# Patient Record
Sex: Female | Born: 1945
Health system: Southern US, Community
[De-identification: ages and names within clinical notes are randomized; demographics above are authoritative.]

## PROBLEM LIST (undated history)

## (undated) DIAGNOSIS — I10 Essential (primary) hypertension: Secondary | ICD-10-CM

## (undated) DIAGNOSIS — Z6834 Body mass index (BMI) 34.0-34.9, adult: Secondary | ICD-10-CM

## (undated) HISTORY — PX: ABDOMINAL HYSTERECTOMY: SHX81

## (undated) HISTORY — DX: Body mass index (bmi) 34.0-34.9, adult: Z68.34

## (undated) HISTORY — DX: Essential (primary) hypertension: I10

---

## 2006-04-06 ENCOUNTER — Emergency Department (HOSPITAL_COMMUNITY): Admission: EM | Admit: 2006-04-06 | Discharge: 2006-04-06 | Payer: Self-pay | Admitting: Emergency Medicine

## 2011-10-29 ENCOUNTER — Telehealth: Payer: Self-pay | Admitting: Family Medicine

## 2011-10-29 NOTE — Telephone Encounter (Addendum)
plz have her check her blood pressure at store for next 1-2 wks and call us with #s, may need sooner appt.

## 2011-10-29 NOTE — Telephone Encounter (Signed)
Patient called to schedule a new medicare patient appointment.  I let her know your first available is the end of September.  Patient said she's having blood pressure problems.  I asked her for her blood pressure readings,but she said she hasn't taken her blood pressure.  I scheduled her an appointment with you on 12/10/11.  Patient wanted me to leave you a message to let you know that she's having blood pressure problems.

## 2011-10-30 NOTE — Telephone Encounter (Signed)
Patient notified and will check numbers and call with an update.

## 2011-11-03 ENCOUNTER — Encounter: Payer: Self-pay | Admitting: Family Medicine

## 2011-11-03 ENCOUNTER — Ambulatory Visit (INDEPENDENT_AMBULATORY_CARE_PROVIDER_SITE_OTHER): Payer: Medicare HMO | Admitting: Family Medicine

## 2011-11-03 ENCOUNTER — Other Ambulatory Visit (HOSPITAL_COMMUNITY)
Admission: RE | Admit: 2011-11-03 | Discharge: 2011-11-03 | Disposition: A | Discharge status: Home / Self Care | Payer: Medicare HMO | Source: Ambulatory Visit | Attending: Family Medicine | Admitting: Family Medicine

## 2011-11-03 VITALS — BP 144/96 | HR 77 | Ht 67.0 in | Wt 215.0 lb

## 2011-11-03 DIAGNOSIS — E669 Obesity, unspecified: Secondary | ICD-10-CM | POA: Insufficient documentation

## 2011-11-03 DIAGNOSIS — Z01419 Encounter for gynecological examination (general) (routine) without abnormal findings: Secondary | ICD-10-CM

## 2011-11-03 DIAGNOSIS — I10 Essential (primary) hypertension: Secondary | ICD-10-CM | POA: Insufficient documentation

## 2011-11-03 DIAGNOSIS — Z124 Encounter for screening for malignant neoplasm of cervix: Secondary | ICD-10-CM | POA: Insufficient documentation

## 2011-11-03 MED ORDER — HYDROCHLOROTHIAZIDE 25 MG PO TABS: 25.0000 mg | ORAL_TABLET | Freq: Every day | ORAL | Status: DC

## 2011-11-03 NOTE — Progress Notes (Signed)
Subjective:     Kylie Diaz is a 66 y.o. female and is here for a comprehensive physical exam. The patient reports problems - Feeling very depressed. She graduated college magnetic mildly from [employer] in 2008 and has been unable to secure a job. 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 She's been out of her blood pressure medicines for several days. She has made an appointment with primary care physician the office of the [place] at [facility]. Her husband has a retired from the sheriff's office. He is recently been diagnosed with diabetes. He is unable to perform sexually secondary to this diagnosis and so she is no longer sexually active. The patient has undergone abdominal hysterectomy for fibroid uterus in the past and is without cervix. Her ovaries remain. She has not had any colonoscopy, or recent mammogram..  History   Social History  . Marital Status: Married    Spouse Name: N/A    Number of Children: N/A  . Years of Education: N/A   Occupational History  . Not on file.   Social History Main Topics  . Smoking status: Former Smoker    Types: Cigarettes    Quit date: 11/03/1978  . Smokeless tobacco: Not on file   Comment: 1980  . Alcohol Use: Yes     social  . Drug Use: No  . Sexually Active: Not Currently -- Female partner(s)    Birth Control/ Protection: Surgical     hysterectomy.   Other Topics Concern  . Not on file   Social History Narrative  . No narrative on file   Health Maintenance  Topic Date Due  . Tetanus/tdap  09/21/1965  . Mammogram  09/21/1996  . Colonoscopy  09/21/1996  . Zostavax  09/22/2006  . Pneumococcal Polysaccharide Vaccine Age 23 And Over  09/22/2011  . Influenza Vaccine  12/15/2011    The following portions of the patient's history were reviewed and updated as appropriate: allergies, current medications, past family history, past medical history, past social history, past surgical history and problem list.  Review of  Systems A comprehensive review of systems was negative.   Objective:    BP 144/96  Pulse 77  Ht 5\' 7"  (1.702 m)  Wt 215 lb (97.523 kg)  BMI 33.67 kg/m2 General appearance: alert, cooperative and appears stated age Head: Normocephalic, without obvious abnormality, atraumatic Neck: no adenopathy, supple, symmetrical, trachea midline and thyroid not enlarged, symmetric, no tenderness/mass/nodules Lungs: clear to auscultation bilaterally Breasts: normal appearance, no masses or tenderness Heart: regular rate and rhythm, S1, S2 normal, no murmur, click, rub or gallop Abdomen: soft, non-tender; bowel sounds normal; no masses,  no organomegaly Pelvic: adnexae not palpable, external genitalia normal, uterus surgically absent, vagina normal without discharge and Vaginal atrophy Extremities: extremities normal, atraumatic, no cyanosis or edema Pulses: 2+ and symmetric Skin: Skin color, texture, turgor normal. No rashes or lesions Lymph nodes: Cervical, supraclavicular, and axillary nodes normal. Neurologic: Grossly normal    Assessment:    Healthy female exam. Hypertension, and obesity.     Plan:     Pap smear today the patient last, lengthy discussion was had with the patient about need for further Pap smears that given the absence of the cervix was well at age of 63. The patient was very uncomfortable with this continuing this yearly. A mammogram scheduled for the patient. She was referred for her colonoscopy. Referral made to [facility] healthcare primary care maintenance. Refilled her HCTZ 25 mg by mouth  daily #30 given with 3 refills. See After Visit Summary for Counseling Recommendations

## 2011-11-03 NOTE — Patient Instructions (Signed)
Preventive Care for Adults, Female A healthy lifestyle and preventive care can promote health and wellness. Preventive health guidelines for women include the following key practices.  A routine yearly physical is a good way to check with your caregiver about your health and preventive screening. It is a chance to share any concerns and updates on your health, and to receive a thorough exam.   Visit your dentist for a routine exam and preventive care every 6 months. Brush your teeth twice a day and floss once a day. Good oral hygiene prevents tooth decay and gum disease.   The frequency of eye exams is based on your age, health, family medical history, use of contact lenses, and other factors. Follow your caregiver's recommendations for frequency of eye exams.   Eat a healthy diet. Foods like vegetables, fruits, whole grains, low-fat dairy products, and lean protein foods contain the nutrients you need without too many calories. Decrease your intake of foods high in solid fats, added sugars, and salt. Eat the right amount of calories for you.Get information about a proper diet from your caregiver, if necessary.   Regular physical exercise is one of the most important things you can do for your health. Most adults should get at least 150 minutes of moderate-intensity exercise (any activity that increases your heart rate and causes you to sweat) each week. In addition, most adults need muscle-strengthening exercises on 2 or more days a week.   Maintain a healthy weight. The body mass index (BMI) is a screening tool to identify possible weight problems. It provides an estimate of body fat based on height and weight. Your caregiver can help determine your BMI, and can help you achieve or maintain a healthy weight.For adults 20 years and older:   A BMI below 18.5 is considered underweight.   A BMI of 18.5 to 24.9 is normal.   A BMI of 25 to 29.9 is considered overweight.   A BMI of 30 and above is  considered obese.   Maintain normal blood lipids and cholesterol levels by exercising and minimizing your intake of saturated fat. Eat a balanced diet with plenty of fruit and vegetables. Blood tests for lipids and cholesterol should begin at age 20 and be repeated every 5 years. If your lipid or cholesterol levels are high, you are over 50, or you are at high risk for heart disease, you may need your cholesterol levels checked more frequently.Ongoing high lipid and cholesterol levels should be treated with medicines if diet and exercise are not effective.   If you smoke, find out from your caregiver how to quit. If you do not use tobacco, do not start.   If you are pregnant, do not drink alcohol. If you are breastfeeding, be very cautious about drinking alcohol. If you are not pregnant and choose to drink alcohol, do not exceed 1 drink per day. One drink is considered to be 12 ounces (355 mL) of beer, 5 ounces (148 mL) of wine, or 1.5 ounces (44 mL) of liquor.   Avoid use of street drugs. Do not share needles with anyone. Ask for help if you need support or instructions about stopping the use of drugs.   High blood pressure causes heart disease and increases the risk of stroke. Your blood pressure should be checked at least every 1 to 2 years. Ongoing high blood pressure should be treated with medicines if weight loss and exercise are not effective.   If you are 55 to 65   years old, ask your caregiver if you should take aspirin to prevent strokes.   Diabetes screening involves taking a blood sample to check your fasting blood sugar level. This should be done once every 3 years, after age 45, if you are within normal weight and without risk factors for diabetes. Testing should be considered at a younger age or be carried out more frequently if you are overweight and have at least 1 risk factor for diabetes.   Breast cancer screening is essential preventive care for women. You should practice "breast  self-awareness." This means understanding the normal appearance and feel of your breasts and may include breast self-examination. Any changes detected, no matter how small, should be reported to a caregiver. Women in their 20s and 30s should have a clinical breast exam (CBE) by a caregiver as part of a regular health exam every 1 to 3 years. After age 40, women should have a CBE every year. Starting at age 40, women should consider having a mammography (breast X-ray test) every year. Women who have a family history of breast cancer should talk to their caregiver about genetic screening. Women at a high risk of breast cancer should talk to their caregivers about having magnetic resonance imaging (MRI) and a mammography every year.   The Pap test is a screening test for cervical cancer. A Pap test can show cell changes on the cervix that might become cervical cancer if left untreated. A Pap test is a procedure in which cells are obtained and examined from the lower end of the uterus (cervix).   Women should have a Pap test starting at age 21.   Between ages 21 and 29, Pap tests should be repeated every 2 years.   Beginning at age 30, you should have a Pap test every 3 years as long as the past 3 Pap tests have been normal.   Some women have medical problems that increase the chance of getting cervical cancer. Talk to your caregiver about these problems. It is especially important to talk to your caregiver if a new problem develops soon after your last Pap test. In these cases, your caregiver may recommend more frequent screening and Pap tests.   The above recommendations are the same for women who have or have not gotten the vaccine for human papillomavirus (HPV).   If you had a hysterectomy for a problem that was not cancer or a condition that could lead to cancer, then you no longer need Pap tests. Even if you no longer need a Pap test, a regular exam is a good idea to make sure no other problems are  starting.   If you are between ages 65 and 70, and you have had normal Pap tests going back 10 years, you no longer need Pap tests. Even if you no longer need a Pap test, a regular exam is a good idea to make sure no other problems are starting.   If you have had past treatment for cervical cancer or a condition that could lead to cancer, you need Pap tests and screening for cancer for at least 20 years after your treatment.   If Pap tests have been discontinued, risk factors (such as a new sexual partner) need to be reassessed to determine if screening should be resumed.   The HPV test is an additional test that may be used for cervical cancer screening. The HPV test looks for the virus that can cause the cell changes on the cervix.   The cells collected during the Pap test can be tested for HPV. The HPV test could be used to screen women aged 30 years and older, and should be used in women of any age who have unclear Pap test results. After the age of 30, women should have HPV testing at the same frequency as a Pap test.   Colorectal cancer can be detected and often prevented. Most routine colorectal cancer screening begins at the age of 50 and continues through age 75. However, your caregiver may recommend screening at an earlier age if you have risk factors for colon cancer. On a yearly basis, your caregiver may provide home test kits to check for hidden blood in the stool. Use of a small camera at the end of a tube, to directly examine the colon (sigmoidoscopy or colonoscopy), can detect the earliest forms of colorectal cancer. Talk to your caregiver about this at age 50, when routine screening begins. Direct examination of the colon should be repeated every 5 to 10 years through age 75, unless early forms of pre-cancerous polyps or small growths are found.   Hepatitis C blood testing is recommended for all people born from 1945 through 1965 and any individual with known risks for hepatitis C.    Practice safe sex. Use condoms and avoid high-risk sexual practices to reduce the spread of sexually transmitted infections (STIs). STIs include gonorrhea, chlamydia, syphilis, trichomonas, herpes, HPV, and human immunodeficiency virus (HIV). Herpes, HIV, and HPV are viral illnesses that have no cure. They can result in disability, cancer, and death. Sexually active women aged 25 and younger should be checked for chlamydia. Older women with new or multiple partners should also be tested for chlamydia. Testing for other STIs is recommended if you are sexually active and at increased risk.   Osteoporosis is a disease in which the bones lose minerals and strength with aging. This can result in serious bone fractures. The risk of osteoporosis can be identified using a bone density scan. Women ages 65 and over and women at risk for fractures or osteoporosis should discuss screening with their caregivers. Ask your caregiver whether you should take a calcium supplement or vitamin D to reduce the rate of osteoporosis.   Menopause can be associated with physical symptoms and risks. Hormone replacement therapy is available to decrease symptoms and risks. You should talk to your caregiver about whether hormone replacement therapy is right for you.   Use sunscreen with sun protection factor (SPF) of 30 or more. Apply sunscreen liberally and repeatedly throughout the day. You should seek shade when your shadow is shorter than you. Protect yourself by wearing long sleeves, pants, a wide-brimmed hat, and sunglasses year round, whenever you are outdoors.   Once a month, do a whole body skin exam, using a mirror to look at the skin on your back. Notify your caregiver of new moles, moles that have irregular borders, moles that are larger than a pencil eraser, or moles that have changed in shape or color.   Stay current with required immunizations.   Influenza. You need a dose every fall (or winter). The composition of  the flu vaccine changes each year, so being vaccinated once is not enough.   Pneumococcal polysaccharide. You need 1 to 2 doses if you smoke cigarettes or if you have certain chronic medical conditions. You need 1 dose at age 65 (or older) if you have never been vaccinated.   Tetanus, diphtheria, pertussis (Tdap, Td). Get 1 dose of   Tdap vaccine if you are younger than age 65, are over 65 and have contact with an infant, are a healthcare worker, are pregnant, or simply want to be protected from whooping cough. After that, you need a Td booster dose every 10 years. Consult your caregiver if you have not had at least 3 tetanus and diphtheria-containing shots sometime in your life or have a deep or dirty wound.   HPV. You need this vaccine if you are a woman age 26 or younger. The vaccine is given in 3 doses over 6 months.   Measles, mumps, rubella (MMR). You need at least 1 dose of MMR if you were born in 1957 or later. You may also need a second dose.   Meningococcal. If you are age 19 to 21 and a first-year college student living in a residence hall, or have one of several medical conditions, you need to get vaccinated against meningococcal disease. You may also need additional booster doses.   Zoster (shingles). If you are age 60 or older, you should get this vaccine.   Varicella (chickenpox). If you have never had chickenpox or you were vaccinated but received only 1 dose, talk to your caregiver to find out if you need this vaccine.   Hepatitis A. You need this vaccine if you have a specific risk factor for hepatitis A virus infection or you simply wish to be protected from this disease. The vaccine is usually given as 2 doses, 6 to 18 months apart.   Hepatitis B. You need this vaccine if you have a specific risk factor for hepatitis B virus infection or you simply wish to be protected from this disease. The vaccine is given in 3 doses, usually over 6 months.  Preventive Services /  Frequency Ages 19 to 39  Blood pressure check.** / Every 1 to 2 years.   Lipid and cholesterol check.** / Every 5 years beginning at age 20.   Clinical breast exam.** / Every 3 years for women in their 20s and 30s.   Pap test.** / Every 2 years from ages 21 through 29. Every 3 years starting at age 30 through age 65 or 70 with a history of 3 consecutive normal Pap tests.   HPV screening.** / Every 3 years from ages 30 through ages 65 to 70 with a history of 3 consecutive normal Pap tests.   Hepatitis C blood test.** / For any individual with known risks for hepatitis C.   Skin self-exam. / Monthly.   Influenza immunization.** / Every year.   Pneumococcal polysaccharide immunization.** / 1 to 2 doses if you smoke cigarettes or if you have certain chronic medical conditions.   Tetanus, diphtheria, pertussis (Tdap, Td) immunization. / A one-time dose of Tdap vaccine. After that, you need a Td booster dose every 10 years.   HPV immunization. / 3 doses over 6 months, if you are 26 and younger.   Measles, mumps, rubella (MMR) immunization. / You need at least 1 dose of MMR if you were born in 1957 or later. You may also need a second dose.   Meningococcal immunization. / 1 dose if you are age 19 to 21 and a first-year college student living in a residence hall, or have one of several medical conditions, you need to get vaccinated against meningococcal disease. You may also need additional booster doses.   Varicella immunization.** / Consult your caregiver.   Hepatitis A immunization.** / Consult your caregiver. 2 doses, 6 to 18 months   apart.   Hepatitis B immunization.** / Consult your caregiver. 3 doses usually over 6 months.  Ages 40 to 64  Blood pressure check.** / Every 1 to 2 years.   Lipid and cholesterol check.** / Every 5 years beginning at age 20.   Clinical breast exam.** / Every year after age 40.   Mammogram.** / Every year beginning at age 40 and continuing for as  long as you are in good health. Consult with your caregiver.   Pap test.** / Every 3 years starting at age 30 through age 65 or 70 with a history of 3 consecutive normal Pap tests.   HPV screening.** / Every 3 years from ages 30 through ages 65 to 70 with a history of 3 consecutive normal Pap tests.   Fecal occult blood test (FOBT) of stool. / Every year beginning at age 50 and continuing until age 75. You may not need to do this test if you get a colonoscopy every 10 years.   Flexible sigmoidoscopy or colonoscopy.** / Every 5 years for a flexible sigmoidoscopy or every 10 years for a colonoscopy beginning at age 50 and continuing until age 75.   Hepatitis C blood test.** / For all people born from 1945 through 1965 and any individual with known risks for hepatitis C.   Skin self-exam. / Monthly.   Influenza immunization.** / Every year.   Pneumococcal polysaccharide immunization.** / 1 to 2 doses if you smoke cigarettes or if you have certain chronic medical conditions.   Tetanus, diphtheria, pertussis (Tdap, Td) immunization.** / A one-time dose of Tdap vaccine. After that, you need a Td booster dose every 10 years.   Measles, mumps, rubella (MMR) immunization. / You need at least 1 dose of MMR if you were born in 1957 or later. You may also need a second dose.   Varicella immunization.** / Consult your caregiver.   Meningococcal immunization.** / Consult your caregiver.   Hepatitis A immunization.** / Consult your caregiver. 2 doses, 6 to 18 months apart.   Hepatitis B immunization.** / Consult your caregiver. 3 doses, usually over 6 months.  Ages 65 and over  Blood pressure check.** / Every 1 to 2 years.   Lipid and cholesterol check.** / Every 5 years beginning at age 20.   Clinical breast exam.** / Every year after age 40.   Mammogram.** / Every year beginning at age 40 and continuing for as long as you are in good health. Consult with your caregiver.   Pap test.** /  Every 3 years starting at age 30 through age 65 or 70 with a 3 consecutive normal Pap tests. Testing can be stopped between 65 and 70 with 3 consecutive normal Pap tests and no abnormal Pap or HPV tests in the past 10 years.   HPV screening.** / Every 3 years from ages 30 through ages 65 or 70 with a history of 3 consecutive normal Pap tests. Testing can be stopped between 65 and 70 with 3 consecutive normal Pap tests and no abnormal Pap or HPV tests in the past 10 years.   Fecal occult blood test (FOBT) of stool. / Every year beginning at age 50 and continuing until age 75. You may not need to do this test if you get a colonoscopy every 10 years.   Flexible sigmoidoscopy or colonoscopy.** / Every 5 years for a flexible sigmoidoscopy or every 10 years for a colonoscopy beginning at age 50 and continuing until age 75.   Hepatitis   C blood test.** / For all people born from 1945 through 1965 and any individual with known risks for hepatitis C.   Osteoporosis screening.** / A one-time screening for women ages 65 and over and women at risk for fractures or osteoporosis.   Skin self-exam. / Monthly.   Influenza immunization.** / Every year.   Pneumococcal polysaccharide immunization.** / 1 dose at age 65 (or older) if you have never been vaccinated.   Tetanus, diphtheria, pertussis (Tdap, Td) immunization. / A one-time dose of Tdap vaccine if you are over 65 and have contact with an infant, are a healthcare worker, or simply want to be protected from whooping cough. After that, you need a Td booster dose every 10 years.   Varicella immunization.** / Consult your caregiver.   Meningococcal immunization.** / Consult your caregiver.   Hepatitis A immunization.** / Consult your caregiver. 2 doses, 6 to 18 months apart.   Hepatitis B immunization.** / Check with your caregiver. 3 doses, usually over 6 months.  ** Family history and personal history of risk and conditions may change your caregiver's  recommendations. Document Released: 04/28/2001 Document Revised: 02/19/2011 Document Reviewed: 07/28/2010 [employer] Patient Information 2012 [employer]. 

## 2011-11-18 ENCOUNTER — Encounter: Payer: Self-pay | Admitting: Family Medicine

## 2011-12-10 ENCOUNTER — Ambulatory Visit: Payer: Self-pay | Admitting: Family Medicine

## 2011-12-11 ENCOUNTER — Ambulatory Visit: Payer: Medicare HMO | Admitting: Family Medicine

## 2012-01-08 ENCOUNTER — Ambulatory Visit: Payer: Medicare HMO | Admitting: Family Medicine

## 2012-02-10 ENCOUNTER — Ambulatory Visit: Payer: Self-pay | Admitting: Internal Medicine

## 2012-03-22 ENCOUNTER — Encounter: Payer: Self-pay | Admitting: Internal Medicine

## 2012-03-22 ENCOUNTER — Ambulatory Visit (INDEPENDENT_AMBULATORY_CARE_PROVIDER_SITE_OTHER): Payer: Medicare HMO | Admitting: Internal Medicine

## 2012-03-22 ENCOUNTER — Other Ambulatory Visit: Payer: Self-pay | Admitting: Family Medicine

## 2012-03-22 ENCOUNTER — Ambulatory Visit (INDEPENDENT_AMBULATORY_CARE_PROVIDER_SITE_OTHER)
Admission: RE | Admit: 2012-03-22 | Discharge: 2012-03-22 | Disposition: A | Discharge status: Home / Self Care | Payer: Medicare HMO | Source: Ambulatory Visit | Attending: Internal Medicine | Admitting: Internal Medicine

## 2012-03-22 VITALS — BP 124/82 | HR 81 | Temp 98.6°F | Ht 67.75 in | Wt 219.5 lb

## 2012-03-22 DIAGNOSIS — E785 Hyperlipidemia, unspecified: Secondary | ICD-10-CM

## 2012-03-22 DIAGNOSIS — I10 Essential (primary) hypertension: Secondary | ICD-10-CM

## 2012-03-22 DIAGNOSIS — D649 Anemia, unspecified: Secondary | ICD-10-CM

## 2012-03-22 DIAGNOSIS — J40 Bronchitis, not specified as acute or chronic: Secondary | ICD-10-CM

## 2012-03-22 DIAGNOSIS — F4321 Adjustment disorder with depressed mood: Secondary | ICD-10-CM

## 2012-03-22 LAB — LIPID PANEL
Cholesterol: 155 mg/dL (ref 0–200)
LDL Cholesterol: 91 mg/dL (ref 0–99)
Total CHOL/HDL Ratio: 3
Triglycerides: 67 mg/dL (ref 0.0–149.0)
VLDL: 13.4 mg/dL (ref 0.0–40.0)

## 2012-03-22 LAB — CBC WITH DIFFERENTIAL/PLATELET
Basophils Absolute: 0 10*3/uL (ref 0.0–0.1)
Eosinophils Absolute: 0.1 10*3/uL (ref 0.0–0.7)
Eosinophils Relative: 1.1 % (ref 0.0–5.0)
Hemoglobin: 11.2 g/dL — ABNORMAL LOW (ref 12.0–15.0)
Lymphs Abs: 1.8 10*3/uL (ref 0.7–4.0)
MCHC: 32 g/dL (ref 30.0–36.0)
MCV: 86.8 fl (ref 78.0–100.0)
Neutro Abs: 4.6 10*3/uL (ref 1.4–7.7)
Neutrophils Relative %: 65.4 % (ref 43.0–77.0)
Platelets: 455 10*3/uL — ABNORMAL HIGH (ref 150.0–400.0)
RBC: 4.03 Mil/uL (ref 3.87–5.11)
RDW: 13.8 % (ref 11.5–14.6)

## 2012-03-22 LAB — COMPREHENSIVE METABOLIC PANEL
AST: 16 U/L (ref 0–37)
BUN: 7 mg/dL (ref 6–23)
Chloride: 106 mEq/L (ref 96–112)
Sodium: 140 mEq/L (ref 135–145)
Total Protein: 7.5 g/dL (ref 6.0–8.3)

## 2012-03-22 LAB — MICROALBUMIN / CREATININE URINE RATIO: Creatinine,U: 177.9 mg/dL

## 2012-03-22 MED ORDER — LEVOFLOXACIN 500 MG PO TABS: 500.0000 mg | ORAL_TABLET | Freq: Every day | ORAL | Status: DC

## 2012-03-22 MED ORDER — HYDROCHLOROTHIAZIDE 25 MG PO TABS: 25.0000 mg | ORAL_TABLET | Freq: Every day | ORAL | Status: DC

## 2012-03-22 NOTE — Assessment & Plan Note (Signed)
Patient having some difficulty adjusting to current job situation. Offered support today. Gave her information for local counselor to work with.

## 2012-03-22 NOTE — Assessment & Plan Note (Signed)
Blood pressure well-controlled on current medications. Will check renal function with labs today. Continue hydrochlorothiazide. Followup in one month for physical exam.

## 2012-03-22 NOTE — Assessment & Plan Note (Signed)
Symptoms and exam are most consistent with bronchitis. Exam was remarkable for some focal findings of rhonchi and poor air transfer in the left upper lobe however chest x-ray was normal. Will treat with Levaquin. Patient will call her symptoms of cough and shortness of breath are not improving over the next 24-48 hours. Followup in 2 weeks.

## 2012-03-22 NOTE — Progress Notes (Signed)
Subjective:    Patient ID: Kylie Diaz, female    DOB: February 28, 1946, 67 y.o.   MRN: [id number]  HPI 67 year old female with history of hypertension presents to establish care. She reports it has been a difficult time for her. She recently completed a bachelors degree in Investment banker, corporate. However, she has had difficulty finding a job. She is currently working as a Lawyer. She is having difficulty paying her student months. She describes frustration with this and some depressed mood. She is interested in seeing a counselor to help work through symptoms. Next  In regards to hypertension, she reports compliance with her medication. She denies any headache, palpitations, chest pain.  She is also concerned today about 3-week history of cough and occasional wheezing. Symptoms first began around Christmas. They started with subjective fever, chills, general malaise, sore throat, nasal congestion. She then developed cough productive of purulent sputum. She has been taking guaifenesin with no improvement in her symptoms. She has had some mild shortness of breath and occasional wheezing. She denies any recurrent fever or chest pain.  Outpatient Encounter Prescriptions as of 03/22/2012  Medication Sig Dispense Refill  . guaifenesin (MUCUS RELIEF) 400 MG TABS Take 400 mg by mouth every 4 (four) hours.      . [DISCONTINUED] hydrochlorothiazide (HYDRODIURIL) 25 MG tablet Take 1 tablet (25 mg total) by mouth daily.  30 tablet  3  . levofloxacin (LEVAQUIN) 500 MG tablet Take 1 tablet (500 mg total) by mouth daily.  7 tablet  0   BP 124/82  Pulse 81  Temp 98.6 F (37 C) (Oral)  Ht 5' 7.75" (1.721 m)  Wt 219 lb 8 oz (99.565 kg)  BMI 33.62 kg/m2  SpO2 95%  Review of Systems  Constitutional: Positive for fatigue. Negative for fever, chills, appetite change and unexpected weight change.  HENT: Negative for ear pain, congestion, sore throat, trouble swallowing, neck pain, voice change and sinus  pressure.   Eyes: Negative for visual disturbance.  Respiratory: Positive for cough and shortness of breath. Negative for wheezing and stridor.   Cardiovascular: Negative for chest pain, palpitations and leg swelling.  Gastrointestinal: Negative for nausea, vomiting, abdominal pain, diarrhea, constipation, blood in stool, abdominal distention and anal bleeding.  Genitourinary: Negative for dysuria and flank pain.  Musculoskeletal: Negative for myalgias, arthralgias and gait problem.  Skin: Negative for color change and rash.  Neurological: Negative for dizziness and headaches.  Hematological: Negative for adenopathy. Does not bruise/bleed easily.  Psychiatric/Behavioral: Positive for dysphoric mood. Negative for suicidal ideas and sleep disturbance. The patient is not nervous/anxious.        Objective:   Physical Exam  Constitutional: She is oriented to person, place, and time. She appears well-developed and well-nourished. No distress.  HENT:  Head: Normocephalic and atraumatic.  Right Ear: External ear normal.  Left Ear: External ear normal.  Nose: Nose normal.  Mouth/Throat: Oropharynx is clear and moist. No oropharyngeal exudate.  Eyes: Conjunctivae normal are normal. Pupils are equal, round, and reactive to light. Right eye exhibits no discharge. Left eye exhibits no discharge. No scleral icterus.  Neck: Normal range of motion. Neck supple. No tracheal deviation present. No thyromegaly present.  Cardiovascular: Normal rate, regular rhythm, normal heart sounds and intact distal pulses.  Exam reveals no gallop and no friction rub.   No murmur heard. Pulmonary/Chest: Effort normal. No accessory muscle usage. Not tachypneic. No respiratory distress. She has decreased breath sounds in the left upper field. She has no wheezes. She  has rhonchi in the left upper field. She has no rales. She exhibits no tenderness.  Musculoskeletal: Normal range of motion. She exhibits no edema and no  tenderness.  Lymphadenopathy:    She has no cervical adenopathy.  Neurological: She is alert and oriented to person, place, and time. No cranial nerve deficit. She exhibits normal muscle tone. Coordination normal.  Skin: Skin is warm and dry. No rash noted. She is not diaphoretic. No erythema. No pallor.  Psychiatric: She has a normal mood and affect. Her behavior is normal. Judgment and thought content normal.          Assessment & Plan:

## 2012-03-22 NOTE — Telephone Encounter (Signed)
Refill on her HCTZ 25 mg.

## 2012-03-23 ENCOUNTER — Telehealth: Payer: Self-pay | Admitting: *Deleted

## 2012-03-23 ENCOUNTER — Ambulatory Visit: Payer: Medicare HMO

## 2012-03-23 DIAGNOSIS — D649 Anemia, unspecified: Secondary | ICD-10-CM

## 2012-03-23 NOTE — Telephone Encounter (Signed)
Will route to new PCP 

## 2012-03-23 NOTE — Telephone Encounter (Signed)
This pt is coming in tomorrow(03-24-2012) for labs, is it just for the IFOB? Thank you

## 2012-03-24 ENCOUNTER — Other Ambulatory Visit: Payer: Medicare HMO

## 2012-03-24 NOTE — Telephone Encounter (Signed)
Yes, as I think the ferritin was already added on.

## 2012-03-29 NOTE — Progress Notes (Signed)
Call patient home number. Spoke with patient spouse Verdon Cummins. Per spouse will have patient call office back regarding her additional mammo that needed to be done.

## 2012-03-31 ENCOUNTER — Other Ambulatory Visit: Payer: Medicare HMO

## 2012-03-31 ENCOUNTER — Other Ambulatory Visit: Payer: Self-pay | Admitting: Internal Medicine

## 2012-03-31 DIAGNOSIS — D649 Anemia, unspecified: Secondary | ICD-10-CM

## 2012-04-01 ENCOUNTER — Other Ambulatory Visit (INDEPENDENT_AMBULATORY_CARE_PROVIDER_SITE_OTHER): Payer: Medicare HMO

## 2012-04-01 ENCOUNTER — Other Ambulatory Visit: Payer: Self-pay | Admitting: Internal Medicine

## 2012-04-01 DIAGNOSIS — D649 Anemia, unspecified: Secondary | ICD-10-CM

## 2012-04-04 ENCOUNTER — Encounter: Payer: Self-pay | Admitting: Internal Medicine

## 2012-04-04 ENCOUNTER — Ambulatory Visit (INDEPENDENT_AMBULATORY_CARE_PROVIDER_SITE_OTHER): Payer: Medicare HMO | Admitting: Internal Medicine

## 2012-04-04 ENCOUNTER — Ambulatory Visit: Payer: Medicare HMO | Admitting: Internal Medicine

## 2012-04-04 VITALS — BP 126/78 | HR 96 | Temp 98.1°F | Ht 67.25 in | Wt 216.5 lb

## 2012-04-04 DIAGNOSIS — D649 Anemia, unspecified: Secondary | ICD-10-CM | POA: Insufficient documentation

## 2012-04-04 DIAGNOSIS — F4321 Adjustment disorder with depressed mood: Secondary | ICD-10-CM

## 2012-04-04 LAB — CBC WITH DIFFERENTIAL/PLATELET
Lymphs Abs: 1.7 10*3/uL (ref 0.7–4.0)
MCHC: 32.6 g/dL (ref 30.0–36.0)
Platelets: 368 10*3/uL (ref 150.0–400.0)
RDW: 14.3 % (ref 11.5–14.6)

## 2012-04-04 LAB — IBC PANEL: Saturation Ratios: 24.1 % (ref 20.0–50.0)

## 2012-04-04 MED ORDER — FLUOXETINE HCL 20 MG PO TABS: 20.0000 mg | ORAL_TABLET | Freq: Every day | ORAL | Status: DC

## 2012-04-04 NOTE — Progress Notes (Signed)
Subjective:    Patient ID: Kylie Diaz, female    DOB: [birth year], 67 y.o.   MRN: [id number]  HPI 67 year old female with history of hypertension presents for followup. At her last visit, she reported feeling increased symptoms of depressed mood and frustration given the fact that she recently completed her bachelors degree in [employer] but has been unable to find a job. She reports worsening frustration and depressed mood. She is tearful today. She has started working as a Lawyer. She reports frustration the fact that she has now completed her degree but cannot afford to pay back her student months. She notes every day symptoms of depression. She has never taken medication to help with depression. She sometimes has difficulty sleeping and in her interactions with others particularly her husband.  At her last visit, she was also noted to be anemic. She reports she has been told she is anemic in the past. She denies any vaginal bleeding. She denies any blood in her stool. She is up-to-date on colonoscopy. She denies fatigue.  Outpatient Encounter Prescriptions as of 04/04/2012  Medication Sig Dispense Refill  . Calcium Carbonate-Vitamin D (CALCIUM-VITAMIN D) 500-200 MG-UNIT per tablet Take 2 tablets by mouth 3 (three) times daily with meals.      . hydrochlorothiazide (HYDRODIURIL) 25 MG tablet Take 1 tablet (25 mg total) by mouth daily.  30 tablet  6  . FLUoxetine (PROZAC) 20 MG tablet Take 1 tablet (20 mg total) by mouth daily.  30 tablet  3   BP 126/78  Pulse 96  Temp 98.1 F (36.7 C) (Oral)  Ht 5' 7.25" (1.708 m)  Wt 216 lb 8 oz (98.204 kg)  BMI 33.66 kg/m2  SpO2 98%  Review of Systems  Constitutional: Negative for fever, chills, appetite change, fatigue and unexpected weight change.  HENT: Negative for ear pain, congestion, sore throat, trouble swallowing, neck pain, voice change and sinus pressure.   Eyes: Negative for visual disturbance.  Respiratory: Negative  for cough, shortness of breath, wheezing and stridor.   Cardiovascular: Negative for chest pain, palpitations and leg swelling.  Gastrointestinal: Negative for nausea, vomiting, abdominal pain, diarrhea, constipation, blood in stool, abdominal distention and anal bleeding.  Genitourinary: Negative for dysuria and flank pain.  Musculoskeletal: Negative for myalgias, arthralgias and gait problem.  Skin: Negative for color change and rash.  Neurological: Negative for dizziness and headaches.  Hematological: Negative for adenopathy. Does not bruise/bleed easily.  Psychiatric/Behavioral: Positive for sleep disturbance and dysphoric mood. Negative for suicidal ideas and decreased concentration. The patient is not nervous/anxious.        Objective:   Physical Exam  Constitutional: She is oriented to person, place, and time. She appears well-developed and well-nourished. No distress.  HENT:  Head: Normocephalic and atraumatic.  Right Ear: External ear normal.  Left Ear: External ear normal.  Nose: Nose normal.  Mouth/Throat: Oropharynx is clear and moist. No oropharyngeal exudate.  Eyes: Conjunctivae normal are normal. Pupils are equal, round, and reactive to light. Right eye exhibits no discharge. Left eye exhibits no discharge. No scleral icterus.  Neck: Normal range of motion. Neck supple. No tracheal deviation present. No thyromegaly present.  Cardiovascular: Normal rate, regular rhythm, normal heart sounds and intact distal pulses.  Exam reveals no gallop and no friction rub.   No murmur heard. Pulmonary/Chest: Effort normal and breath sounds normal. No respiratory distress. She has no wheezes. She has no rales. She exhibits no tenderness.  Musculoskeletal: Normal range of motion. She  exhibits no edema and no tenderness.  Lymphadenopathy:    She has no cervical adenopathy.  Neurological: She is alert and oriented to person, place, and time. No cranial nerve deficit. She exhibits normal  muscle tone. Coordination normal.  Skin: Skin is warm and dry. No rash noted. She is not diaphoretic. No erythema. No pallor.  Psychiatric: Her speech is normal and behavior is normal. Judgment and thought content normal. Cognition and memory are normal. She exhibits a depressed mood.          Assessment & Plan:

## 2012-04-04 NOTE — ED Provider Notes (Signed)
Order(s) created erroneously. Erroneous order ID: [id number] Order moved by: PETERS, MARSHA B Order move date/time: 04/04/2012  4:30 PM Source Patient:    [id number] Source Contact: 04/01/2012 Destination Patient:   [id number] Destination Contact: 12/26/2010

## 2012-04-04 NOTE — Assessment & Plan Note (Signed)
Noted to have anemia on previous labs with Hgb 11.2. Ferritin was elevated. Stool hemocult neg. Will recheck CBC with ferritin and iron panel today.

## 2012-04-04 NOTE — Assessment & Plan Note (Signed)
Greater than face to face discussion with pt today discussing etiology of depressive symptoms and plan for care. Will set up for counseling with career counselor/psychologist. Will start Fluoxetine 20mg  daily. Pt will RTC in 1 month and prn.

## 2012-04-11 ENCOUNTER — Ambulatory Visit: Payer: Medicare HMO

## 2012-04-12 ENCOUNTER — Ambulatory Visit: Payer: Medicare HMO

## 2012-04-30 ENCOUNTER — Other Ambulatory Visit: Payer: Self-pay

## 2012-05-20 ENCOUNTER — Ambulatory Visit: Payer: Medicare HMO | Admitting: Internal Medicine

## 2012-08-22 ENCOUNTER — Ambulatory Visit: Payer: Medicare HMO | Admitting: Internal Medicine

## 2012-09-07 ENCOUNTER — Telehealth: Payer: Self-pay | Admitting: Internal Medicine

## 2012-09-07 NOTE — Telephone Encounter (Signed)
hydrochlorothiazide (HYDRODIURIL) 25 MG tablet °# 90 °

## 2012-09-08 MED ORDER — HYDROCHLOROTHIAZIDE 25 MG PO TABS: 25.0000 mg | ORAL_TABLET | Freq: Every day | ORAL | Status: DC

## 2012-09-08 NOTE — Telephone Encounter (Signed)
DONE

## 2012-10-19 ENCOUNTER — Other Ambulatory Visit: Payer: Self-pay

## 2012-11-20 ENCOUNTER — Other Ambulatory Visit: Payer: Self-pay | Admitting: Internal Medicine

## 2012-11-21 NOTE — Telephone Encounter (Signed)
Eprescribed.

## 2012-12-01 ENCOUNTER — Encounter: Payer: Medicare HMO | Admitting: Internal Medicine

## 2013-01-19 ENCOUNTER — Other Ambulatory Visit: Payer: Self-pay

## 2013-04-11 ENCOUNTER — Encounter: Payer: Self-pay | Admitting: Internal Medicine

## 2013-04-11 ENCOUNTER — Ambulatory Visit (INDEPENDENT_AMBULATORY_CARE_PROVIDER_SITE_OTHER): Payer: Medicare HMO | Admitting: Internal Medicine

## 2013-04-11 VITALS — BP 130/96 | HR 119 | Temp 97.7°F | Ht 67.25 in | Wt 213.0 lb

## 2013-04-11 DIAGNOSIS — L918 Other hypertrophic disorders of the skin: Secondary | ICD-10-CM

## 2013-04-11 DIAGNOSIS — L909 Atrophic disorder of skin, unspecified: Secondary | ICD-10-CM

## 2013-04-11 DIAGNOSIS — I10 Essential (primary) hypertension: Secondary | ICD-10-CM

## 2013-04-11 DIAGNOSIS — D229 Melanocytic nevi, unspecified: Secondary | ICD-10-CM

## 2013-04-11 DIAGNOSIS — L919 Hypertrophic disorder of the skin, unspecified: Secondary | ICD-10-CM

## 2013-04-11 DIAGNOSIS — Z6835 Body mass index (BMI) 35.0-35.9, adult: Secondary | ICD-10-CM | POA: Insufficient documentation

## 2013-04-11 DIAGNOSIS — D239 Other benign neoplasm of skin, unspecified: Secondary | ICD-10-CM

## 2013-04-11 DIAGNOSIS — Z0000 Encounter for general adult medical examination without abnormal findings: Secondary | ICD-10-CM

## 2013-04-11 DIAGNOSIS — Z1239 Encounter for other screening for malignant neoplasm of breast: Secondary | ICD-10-CM

## 2013-04-11 DIAGNOSIS — Z1211 Encounter for screening for malignant neoplasm of colon: Secondary | ICD-10-CM

## 2013-04-11 DIAGNOSIS — Z6834 Body mass index (BMI) 34.0-34.9, adult: Secondary | ICD-10-CM

## 2013-04-11 DIAGNOSIS — E669 Obesity, unspecified: Secondary | ICD-10-CM

## 2013-04-11 HISTORY — DX: Body mass index (BMI) 34.0-34.9, adult: Z68.34

## 2013-04-11 LAB — COMPREHENSIVE METABOLIC PANEL
ALK PHOS: 62 U/L (ref 39–117)
ALT: 10 U/L (ref 0–35)
AST: 11 U/L (ref 0–37)
Albumin: 4 g/dL (ref 3.5–5.2)
BILIRUBIN TOTAL: 0.5 mg/dL (ref 0.3–1.2)
BUN: 11 mg/dL (ref 6–23)
CO2: 27 mEq/L (ref 19–32)
Calcium: 9.6 mg/dL (ref 8.4–10.5)
Chloride: 108 mEq/L (ref 96–112)
Creatinine, Ser: 0.8 mg/dL (ref 0.4–1.2)
GFR: 89.55 mL/min (ref >60.00)
GLUCOSE: 97 mg/dL (ref 70–99)
Potassium: 4.1 mEq/L (ref 3.5–5.1)
SODIUM: 140 meq/L (ref 135–145)
TOTAL PROTEIN: 7.3 g/dL (ref 6.0–8.3)

## 2013-04-11 LAB — CBC WITH DIFFERENTIAL/PLATELET
BASOS ABS: 0 10*3/uL (ref 0.0–0.1)
Basophils Relative: 0.5 % (ref 0.0–3.0)
EOS ABS: 0.1 10*3/uL (ref 0.0–0.7)
Eosinophils Relative: 0.8 % (ref 0.0–5.0)
HCT: 35.7 % — ABNORMAL LOW (ref 36.0–46.0)
HEMOGLOBIN: 11.5 g/dL — AB (ref 12.0–15.0)
Lymphocytes Relative: 19.8 % (ref 12.0–46.0)
Lymphs Abs: 1.8 10*3/uL (ref 0.7–4.0)
MCHC: 32.2 g/dL (ref 30.0–36.0)
MCV: 87.3 fl (ref 78.0–100.0)
Monocytes Absolute: 0.7 10*3/uL (ref 0.1–1.0)
Monocytes Relative: 8.2 % (ref 3.0–12.0)
Neutro Abs: 6.4 10*3/uL (ref 1.4–7.7)
Neutrophils Relative %: 70.7 % (ref 43.0–77.0)
Platelets: 400 10*3/uL (ref 150.0–400.0)
RBC: 4.09 Mil/uL (ref 3.87–5.11)
RDW: 13.8 % (ref 11.5–14.6)
WBC: 9 10*3/uL (ref 4.5–10.5)

## 2013-04-11 LAB — LIPID PANEL
CHOL/HDL RATIO: 4
CHOLESTEROL: 190 mg/dL (ref 0–200)
HDL: 50.7 mg/dL (ref >39.00)
LDL CALC: 110 mg/dL — AB (ref 0–99)
TRIGLYCERIDES: 149 mg/dL (ref 0.0–149.0)
VLDL: 29.8 mg/dL (ref 0.0–40.0)

## 2013-04-11 LAB — HM COLONOSCOPY

## 2013-04-11 NOTE — Progress Notes (Signed)
Subjective:    Patient ID: Kylie Diaz, female    DOB: Feb 13, 1946, 68 y.o.   MRN: [id number]  HPI  The patient is here for annual Medicare wellness examination and management of other chronic and acute problems.   The risk factors are reflected in the social history.  The roster of all physicians providing medical care to patient - is listed in the Snapshot section of the chart.  Activities of daily living:  The patient is 100% independent in all ADLs: dressing, toileting, feeding as well as independent mobility. Lives with husband and dog, Tiffany. Pt is currently working as a Oceanographer.  Home safety : The patient has smoke detectors in the home. They wear seatbelts. There are no firearms at home. There is no violence in the home.   There is no risks for hepatitis, STDs or HIV. There is no history of blood transfusion. They have no travel history to infectious disease endemic areas of the world.  The patient has seen their dentist in the last six month. Dentist - changing to [facility] They have seen their eye doctor in the last year. Opthalmologist - [facility] No hearing issues. They have deferred audiologic testing in the last year.   They do not  have excessive sun exposure. Discussed the need for sun protection: hats, long sleeves and use of sunscreen if there is significant sun exposure. Dermatologist - None  Diet: the importance of a healthy diet is discussed. They do have a healthy diet.  The benefits of regular aerobic exercise were discussed. She exercises occasionally at [employer] through [employer]  Depression screen: there are no signs or vegative symptoms of depression- irritability, change in appetite, anhedonia, sadness/tearfullness.  Cognitive assessment: the patient manages all their financial and personal affairs and is actively engaged. They could relate day,date,year and events.  HCPOA - none in place at present, husband, Tationna Fullard  The following portions of the patient's history were reviewed and updated as appropriate: allergies, current medications, past family history, past medical history,  past surgical history, past social history  and problem list.  Visual acuity was not assessed per patient preference since she has regular follow up with her ophthalmologist. Hearing and body mass index were assessed and reviewed.   During the course of the visit the patient was educated and counseled about appropriate screening and preventive services including : fall prevention , diabetes screening, nutrition counseling, colorectal cancer screening, and recommended immunizations.    Outpatient Encounter Prescriptions as of 04/11/2013  Medication Sig  . Calcium Carbonate-Vitamin D (CALCIUM-VITAMIN D) 500-200 MG-UNIT per tablet Take 2 tablets by mouth 3 (three) times daily with meals.  . hydrochlorothiazide (HYDRODIURIL) 25 MG tablet Take 1 tablet (25 mg total) by mouth daily.  Marland Kitchen FLUoxetine (PROZAC) 20 MG capsule TAKE 1 TABLET (20 MG TOTAL) BY MOUTH DAILY.   BP 130/96  Pulse 119  Temp(Src) 97.7 F (36.5 C) (Oral)  Ht 5' 7.25" (1.708 m)  Wt 213 lb (96.616 kg)  BMI 33.12 kg/m2  SpO2 95%   Review of Systems  Constitutional: Negative for fever, chills, appetite change, fatigue and unexpected weight change.  HENT: Negative for congestion, ear pain, sinus pressure, sore throat, trouble swallowing and voice change.   Eyes: Negative for visual disturbance.  Respiratory: Negative for cough, shortness of breath, wheezing and stridor.   Cardiovascular: Negative for chest pain, palpitations and leg swelling.  Gastrointestinal: Negative for nausea, vomiting, abdominal pain, diarrhea, constipation, blood  in stool, abdominal distention and anal bleeding.  Genitourinary: Negative for dysuria and flank pain.  Musculoskeletal: Negative for arthralgias, gait problem, myalgias and neck pain.  Skin: Negative for color change and  rash.  Neurological: Negative for dizziness and headaches.  Hematological: Negative for adenopathy. Does not bruise/bleed easily.  Psychiatric/Behavioral: Negative for suicidal ideas, sleep disturbance and dysphoric mood. The patient is not nervous/anxious.        Objective:   Physical Exam  Constitutional: She is oriented to person, place, and time. She appears well-developed and well-nourished. No distress.  HENT:  Head: Normocephalic and atraumatic.  Right Ear: External ear normal.  Left Ear: External ear normal.  Nose: Nose normal.  Mouth/Throat: Oropharynx is clear and moist. No oropharyngeal exudate.  Eyes: Conjunctivae are normal. Pupils are equal, round, and reactive to light. Right eye exhibits no discharge. Left eye exhibits no discharge. No scleral icterus.  Neck: Normal range of motion. Neck supple. No tracheal deviation present. No thyromegaly present.  Cardiovascular: Normal rate, regular rhythm, normal heart sounds and intact distal pulses.  Exam reveals no gallop and no friction rub.   No murmur heard. Pulmonary/Chest: Effort normal and breath sounds normal. No accessory muscle usage. Not tachypneic. No respiratory distress. She has no decreased breath sounds. She has no wheezes. She has no rhonchi. She has no rales. She exhibits no tenderness. Right breast exhibits no inverted nipple, no mass, no nipple discharge, no skin change and no tenderness. Left breast exhibits no inverted nipple, no mass, no nipple discharge, no skin change and no tenderness. Breasts are symmetrical.  Abdominal: Soft. Bowel sounds are normal. She exhibits no distension and no mass. There is no tenderness. There is no rebound and no guarding.  Musculoskeletal: Normal range of motion. She exhibits no edema and no tenderness.  Lymphadenopathy:    She has no cervical adenopathy.  Neurological: She is alert and oriented to person, place, and time. No cranial nerve deficit. She exhibits normal muscle tone.  Coordination normal.  Skin: Skin is warm and dry. No rash noted. She is not diaphoretic. No erythema. No pallor.     Psychiatric: She has a normal mood and affect. Her behavior is normal. Judgment and thought content normal.          Assessment & Plan:

## 2013-04-11 NOTE — Progress Notes (Signed)
Pre-visit discussion using our clinic review tool. No additional management support is needed unless otherwise documented below in the visit note.  

## 2013-04-12 DIAGNOSIS — D229 Melanocytic nevi, unspecified: Secondary | ICD-10-CM | POA: Insufficient documentation

## 2013-04-12 DIAGNOSIS — Z0000 Encounter for general adult medical examination without abnormal findings: Secondary | ICD-10-CM | POA: Insufficient documentation

## 2013-04-12 DIAGNOSIS — L918 Other hypertrophic disorders of the skin: Secondary | ICD-10-CM | POA: Insufficient documentation

## 2013-04-12 DIAGNOSIS — Z1211 Encounter for screening for malignant neoplasm of colon: Secondary | ICD-10-CM | POA: Insufficient documentation

## 2013-04-12 DIAGNOSIS — Z1239 Encounter for other screening for malignant neoplasm of breast: Secondary | ICD-10-CM | POA: Insufficient documentation

## 2013-04-12 NOTE — Assessment & Plan Note (Signed)
General medical exam including breast exam normal today. Pap and pelvic deferred as recently completed by her OB/GYN and normal. Encouraged patient to follow up and have mammogram as she canceled this appointment last year. Encouraged her to get colonoscopy. Place new referral for this. Patient declines influenza and pneumonia vaccines. Will check labs including CMP, CBC, lipid profile. Encouraged healthy diet and regular physical activity with goal of weight loss.

## 2013-04-12 NOTE — Assessment & Plan Note (Signed)
Few acrochordon's noted over neck. Will set up dermatology evaluation for removal.

## 2013-04-12 NOTE — Assessment & Plan Note (Signed)
Multiple dark atypical nevi noted on upper back, neck. Will set up dermatology evaluation for biopsy.

## 2013-04-12 NOTE — Assessment & Plan Note (Signed)
Referral for colonoscopy placed. Strongly encouraged her to follow through with this as she declined colonoscopy in the past.

## 2013-04-12 NOTE — Assessment & Plan Note (Signed)
Mammogram ordered. Strongly encouraged her to followup with this as she missed appointment last year.

## 2013-04-12 NOTE — Assessment & Plan Note (Signed)
BP Readings from Last 3 Encounters:  04/11/13 130/96  04/04/12 126/78  03/22/12 124/82   BP slightly elevated. Will check renal function with labs. Continue HCTZ. Consider adding ACEi if persistent elevation BP.

## 2013-04-12 NOTE — Assessment & Plan Note (Signed)
Wt Readings from Last 3 Encounters:  04/11/13 213 lb (96.616 kg)  04/04/12 216 lb 8 oz (98.204 kg)  03/22/12 219 lb 8 oz (99.565 kg)   Encouraged healthy diet and regular exercise with goal of 40 minutes 3 times per week with goal of weight loss.

## 2013-04-20 ENCOUNTER — Encounter: Payer: Self-pay | Admitting: Emergency Medicine

## 2013-04-20 ENCOUNTER — Telehealth: Payer: Self-pay | Admitting: Internal Medicine

## 2013-04-20 ENCOUNTER — Telehealth: Payer: Self-pay | Admitting: *Deleted

## 2013-04-20 NOTE — Telephone Encounter (Signed)
Left message to call back  

## 2013-04-20 NOTE — Telephone Encounter (Signed)
Message copied by Ronaldo Miyamoto on Thu Apr 20, 2013  4:24 PM ------      Message from: Ronette Deter A      Created: Wed Apr 12, 2013  7:54 AM       Can you make her an earlier follow up to recheck BP in 1 month?      Thanks ------

## 2013-04-20 NOTE — Telephone Encounter (Signed)
Relevant patient education assigned to patient using Emmi. ° °

## 2013-04-21 NOTE — Telephone Encounter (Signed)
Left message for patient to call office back to schedule an appointment 

## 2013-04-24 NOTE — Telephone Encounter (Signed)
Fwd to Dr. Gilford Rile, patient declined appointment on 2/27. She would like this Friday, but there is nothing available.

## 2013-04-24 NOTE — Telephone Encounter (Signed)
We will have to wait and see if any cancellations.

## 2013-04-24 NOTE — Telephone Encounter (Signed)
I left a voice mail on the patient's cell phone to inform her that we have an appointment for Friday 2.13.15 @ 11:30 .

## 2013-04-24 NOTE — Telephone Encounter (Signed)
The patient called to schedule her follow up ,but she can only come this Friday 2.13.15. I don't have any available appointments . Please advise.

## 2013-04-28 ENCOUNTER — Ambulatory Visit (INDEPENDENT_AMBULATORY_CARE_PROVIDER_SITE_OTHER): Payer: Medicare HMO | Admitting: Internal Medicine

## 2013-04-28 ENCOUNTER — Encounter: Payer: Self-pay | Admitting: Internal Medicine

## 2013-04-28 VITALS — BP 140/100 | HR 107 | Temp 98.0°F

## 2013-04-28 DIAGNOSIS — I10 Essential (primary) hypertension: Secondary | ICD-10-CM

## 2013-04-28 MED ORDER — HYDROCHLOROTHIAZIDE 25 MG PO TABS: 25.0000 mg | ORAL_TABLET | Freq: Every day | ORAL | Status: DC

## 2013-04-28 MED ORDER — LISINOPRIL 5 MG PO TABS: 5.0000 mg | ORAL_TABLET | Freq: Every day | ORAL | Status: DC

## 2013-04-28 NOTE — Progress Notes (Signed)
Pre-visit discussion using our clinic review tool. No additional management support is needed unless otherwise documented below in the visit note.  

## 2013-04-28 NOTE — Assessment & Plan Note (Signed)
BP Readings from Last 3 Encounters:  04/28/13 140/100  04/11/13 130/96  04/04/12 126/78   BP continues to be elevated. Pt reluctant to take medication for BP. Encouraged compliance with HCTZ and addition of Lisinopril for better BP control. Will start Lisinopril 5mg  daily. Encouraged low sodium diet and regular exercise. Plan to check Cr and K in 1 week and follow up BP check in 2-4 weeks.  Over 1min of which >50% spent in face-to-face contact with patient discussing plan of care

## 2013-04-28 NOTE — Patient Instructions (Signed)
Start Lisinopril 5mg  daily.  Return for labs next week.  Follow up in 2-4 weeks to recheck blood pressure.

## 2013-04-28 NOTE — Progress Notes (Signed)
Subjective:    Patient ID: Kylie Diaz, female    DOB: 04/10/1945, 68 y.o.   MRN: [id number]  HPI 68YO female with HTN presents for follow up. BP continues to run high, typically >140/90 at home. No chest pain, headache, palpitations. Pt has only taken her HCTZ consistently for 1 week. Was trying to stop medication. Notes some dietary indiscretion and high salt intake.  Review of Systems  Constitutional: Negative for fever, chills, appetite change, fatigue and unexpected weight change.  HENT: Negative for congestion, ear pain, sinus pressure, sore throat, trouble swallowing and voice change.   Eyes: Negative for visual disturbance.  Respiratory: Negative for cough, shortness of breath, wheezing and stridor.   Cardiovascular: Negative for chest pain, palpitations and leg swelling.  Gastrointestinal: Negative for nausea, vomiting, abdominal pain, diarrhea, constipation, blood in stool, abdominal distention and anal bleeding.  Genitourinary: Negative for dysuria and flank pain.  Musculoskeletal: Negative for arthralgias, gait problem, myalgias and neck pain.  Skin: Negative for color change and rash.  Neurological: Negative for dizziness and headaches.  Hematological: Negative for adenopathy. Does not bruise/bleed easily.  Psychiatric/Behavioral: Negative for suicidal ideas, sleep disturbance and dysphoric mood. The patient is not nervous/anxious.        Objective:    BP 140/100  Pulse 107  Temp(Src) 98 F (36.7 C) (Oral)  SpO2 97% Physical Exam  Constitutional: She is oriented to person, place, and time. She appears well-developed and well-nourished. No distress.  HENT:  Head: Normocephalic and atraumatic.  Right Ear: External ear normal.  Left Ear: External ear normal.  Nose: Nose normal.  Mouth/Throat: Oropharynx is clear and moist. No oropharyngeal exudate.  Eyes: Conjunctivae are normal. Pupils are equal, round, and reactive to light. Right eye exhibits no discharge. Left  eye exhibits no discharge. No scleral icterus.  Neck: Normal range of motion. Neck supple. No tracheal deviation present. No thyromegaly present.  Cardiovascular: Normal rate, regular rhythm, normal heart sounds and intact distal pulses.  Exam reveals no gallop and no friction rub.   No murmur heard. Pulmonary/Chest: Effort normal and breath sounds normal. No accessory muscle usage. Not tachypneic. No respiratory distress. She has no decreased breath sounds. She has no wheezes. She has no rhonchi. She has no rales. She exhibits no tenderness.  Musculoskeletal: Normal range of motion. She exhibits no edema and no tenderness.  Lymphadenopathy:    She has no cervical adenopathy.  Neurological: She is alert and oriented to person, place, and time. No cranial nerve deficit. She exhibits normal muscle tone. Coordination normal.  Skin: Skin is warm and dry. No rash noted. She is not diaphoretic. No erythema. No pallor.  Psychiatric: She has a normal mood and affect. Her behavior is normal. Judgment and thought content normal.          Assessment & Plan:   Problem List Items Addressed This Visit   Hypertension - Primary      BP Readings from Last 3 Encounters:  04/28/13 140/100  04/11/13 130/96  04/04/12 126/78   BP continues to be elevated. Pt reluctant to take medication for BP. Encouraged compliance with HCTZ and addition of Lisinopril for better BP control. Will start Lisinopril 5mg  daily. Encouraged low sodium diet and regular exercise. Plan to check Cr and K in 1 week and follow up BP check in 2-4 weeks.  Over 71min of which >50% spent in face-to-face contact with patient discussing plan of care     Relevant Medications  hydrochlorothiazide tablet      lisinopril (PRINIVIL,ZESTRIL) tablet   Other Relevant Orders      Basic Metabolic Panel (BMET)       Return in about 3 weeks (around 05/19/2013) for Recheck of Blood Pressure.

## 2013-05-01 ENCOUNTER — Encounter: Payer: Self-pay | Admitting: Internal Medicine

## 2013-05-26 ENCOUNTER — Ambulatory Visit: Payer: Medicare HMO | Admitting: Internal Medicine

## 2013-12-12 ENCOUNTER — Emergency Department: Payer: Self-pay | Admitting: Emergency Medicine

## 2013-12-12 ENCOUNTER — Telehealth: Payer: Self-pay | Admitting: Internal Medicine

## 2013-12-12 LAB — COMPREHENSIVE METABOLIC PANEL
ALT: 19 U/L
Albumin: 3.7 g/dL (ref 3.4–5.0)
Alkaline Phosphatase: 67 U/L
Anion Gap: 8 (ref 7–16)
BUN: 11 mg/dL (ref 7–18)
Bilirubin,Total: 0.3 mg/dL (ref 0.2–1.0)
CALCIUM: 9 mg/dL (ref 8.5–10.1)
CREATININE: 0.88 mg/dL (ref 0.60–1.30)
Chloride: 108 mmol/L — ABNORMAL HIGH (ref 98–107)
Co2: 27 mmol/L (ref 21–32)
EGFR (African American): >60
GLUCOSE: 101 mg/dL — AB (ref 65–99)
OSMOLALITY: 285 (ref 275–301)
POTASSIUM: 3.5 mmol/L (ref 3.5–5.1)
SGOT(AST): 21 U/L (ref 15–37)
Sodium: 143 mmol/L (ref 136–145)
TOTAL PROTEIN: 7.3 g/dL (ref 6.4–8.2)

## 2013-12-12 LAB — CBC
HCT: 35.6 % (ref 35.0–47.0)
HGB: 11.5 g/dL — ABNORMAL LOW (ref 12.0–16.0)
MCH: 28 pg (ref 26.0–34.0)
MCHC: 32.4 g/dL (ref 32.0–36.0)
MCV: 87 fL (ref 80–100)
Platelet: 403 10*3/uL (ref 150–440)
RBC: 4.11 10*6/uL (ref 3.80–5.20)
RDW: 14 % (ref 11.5–14.5)
WBC: 10.4 10*3/uL (ref 3.6–11.0)

## 2013-12-12 LAB — LIPASE, BLOOD: Lipase: 109 U/L (ref 73–393)

## 2013-12-12 NOTE — Telephone Encounter (Signed)
Advised to ED, [facility]

## 2013-12-12 NOTE — Telephone Encounter (Signed)
Patient Information:  Caller Name: Latrina  Phone: [phone]  Patient: Kylie Diaz, Kylie Diaz  Gender: Female  DOB: Jul 29, 1945  Age: 68 Years  PCP: Ronette Deter (Adults only)  Office Follow Up:  Does the office need to follow up with this patient?: No  Instructions For The Office: N/A  RN Note:  Called office and spoke to Dr.Walker's nurse who advised to go to ED now.  Symptoms  Reason For Call & Symptoms: Calling about chest pain in bottom of chest that lasted about 5 minutes. Had same pain last week.  Reviewed Health History In EMR: Yes  Reviewed Medications In EMR: Yes  Reviewed Allergies In EMR: Yes  Reviewed Surgeries / Procedures: Yes  Date of Onset of Symptoms: 12/12/2013  Guideline(s) Used:  Chest Pain  Disposition Per Guideline:   Go to ED Now (or to Office with PCP Approval)  Reason For Disposition Reached:   Intermittent chest pain and pain has been increasing in severity or frequency  Advice Given:  N/A  Patient Will Follow Care Advice:  YES

## 2014-01-03 ENCOUNTER — Telehealth: Payer: Self-pay | Admitting: Internal Medicine

## 2014-01-03 NOTE — Telephone Encounter (Signed)
OK. Will have to look for a 28min visit next week or the week after, as I am out until Wednesday.

## 2014-01-03 NOTE — Telephone Encounter (Signed)
Please see note below. 

## 2014-01-03 NOTE — Telephone Encounter (Signed)
The patient is needing a sooner appointment . She wants to follow up with her primary after being seen in the  ER on 9.29.15. She was diagnosed with reflux at the ED and she stated that it may be border line pancreatic cancer and she wants to be seen as soon as possible.

## 2014-01-05 NOTE — Telephone Encounter (Signed)
Pt's appt is on 01/15/14, notified her that this is the soonest available appt.

## 2014-01-12 ENCOUNTER — Telehealth: Payer: Self-pay | Admitting: Internal Medicine

## 2014-01-12 NOTE — Telephone Encounter (Signed)
Just FYI-  Patient is a current pt of Dr.Walker's.  She has an acute apt on Monday.

## 2014-01-12 NOTE — Telephone Encounter (Signed)
Kylie Diaz, Can you look into this? Is patient trying to change providers? Was there a problem that occurred?

## 2014-01-12 NOTE — Telephone Encounter (Signed)
Pt called and spoke with 2 schedulers at [facility].  Pt insisted on speaking with Dr. Glori Bickers, I explained she is a PC pt of Dr. Gilford Rile.  Pt states that Dr. Glori Bickers has always been on her [place] card since insurance agent signed her up with the policy years ago.  I explained that she need to get corrected PCP listed on card (Dr. Gilford Rile), otherwise there could be difficulty with this type of Value plan Medicare insurance paying correctly.  Ms Liz insisted that she wanted the card and PCP left alone and that it was [facility] responsibility to get her scheduled with Dr. Glori Bickers, who I explained is not and has not been accepting new Medicare patients.  Explained that she needs to get her PCP corrected before she can get authorizations from [facility] for specialists.  Offered to fax form to [facility] as a courtesy to patient, she declined, wanted card left alone.  She stated she was upset and only wanted to be placed on a wait list in the event that Dr. Glori Bickers ever starts accepting New Medicare patients. Placed on a paper wait list in the Medicare book.

## 2014-01-15 ENCOUNTER — Encounter: Payer: Self-pay | Admitting: Internal Medicine

## 2014-01-15 ENCOUNTER — Ambulatory Visit (INDEPENDENT_AMBULATORY_CARE_PROVIDER_SITE_OTHER): Payer: Commercial Managed Care - HMO | Admitting: Internal Medicine

## 2014-01-15 VITALS — BP 134/80 | HR 109 | Temp 98.0°F | Ht 67.25 in | Wt 218.5 lb

## 2014-01-15 DIAGNOSIS — K219 Gastro-esophageal reflux disease without esophagitis: Secondary | ICD-10-CM

## 2014-01-15 MED ORDER — PANTOPRAZOLE SODIUM 40 MG PO TBEC: 40.0000 mg | DELAYED_RELEASE_TABLET | Freq: Two times a day (BID) | ORAL | Status: DC

## 2014-01-15 NOTE — Patient Instructions (Signed)
We will send testing for H. Pylori today.  Stop Zantac.  Start Pantoprazole 40mg  twice daily.  Follow up in 2 weeks.

## 2014-01-15 NOTE — Progress Notes (Signed)
Subjective:    Patient ID: Kylie Diaz, female    DOB: Jul 27, 1945, 68 y.o.   MRN: [id number]  HPI 68YO female presents for acute visit.  Abdominal pain - Seen at [facility] 2 weeks ago for chest pain. Diagnosed with GERD. Started on Ranitidine. Symptoms improved, however stopped taking Zantac because she read this could cause pancreatic cancer. Now having some intense abdominal pain, mid abdomen. Had a syncopal episode at one point when pain was severe. No change in stools. No black or bloody stools. Felt bloated at times. Had this problem in the past.  Review of Systems  Constitutional: Negative for fever, chills, appetite change, fatigue and unexpected weight change.  Eyes: Negative for visual disturbance.  Respiratory: Negative for shortness of breath.   Cardiovascular: Negative for chest pain and leg swelling.  Gastrointestinal: Positive for nausea and abdominal pain (epigastric). Negative for vomiting, diarrhea and constipation.  Skin: Negative for color change and rash.  Neurological: Positive for syncope (with episode of abdominal pain).  Hematological: Negative for adenopathy. Does not bruise/bleed easily.  Psychiatric/Behavioral: Negative for dysphoric mood. The patient is not nervous/anxious.        Objective:    BP 134/80 mmHg  Pulse 109  Temp(Src) 98 F (36.7 C) (Oral)  Ht 5' 7.25" (1.708 m)  Wt 218 lb 8 oz (99.111 kg)  BMI 33.97 kg/m2  SpO2 98% Physical Exam  Constitutional: She is oriented to person, place, and time. She appears well-developed and well-nourished. No distress.  HENT:  Head: Normocephalic and atraumatic.  Right Ear: External ear normal.  Left Ear: External ear normal.  Nose: Nose normal.  Mouth/Throat: Oropharynx is clear and moist. No oropharyngeal exudate.  Eyes: Conjunctivae and EOM are normal. Pupils are equal, round, and reactive to light. Right eye exhibits no discharge.  Neck: Normal range of motion. Neck supple. No thyromegaly present.    Cardiovascular: Normal rate, regular rhythm, normal heart sounds and intact distal pulses.  Exam reveals no gallop and no friction rub.   No murmur heard. Pulmonary/Chest: Effort normal. No respiratory distress. She has no wheezes. She has no rales.  Abdominal: Soft. Bowel sounds are normal. She exhibits no distension and no mass. There is no tenderness. There is no rebound and no guarding.  Musculoskeletal: Normal range of motion. She exhibits no edema or tenderness.  Lymphadenopathy:    She has no cervical adenopathy.  Neurological: She is alert and oriented to person, place, and time. No cranial nerve deficit. Coordination normal.  Skin: Skin is warm and dry. No rash noted. She is not diaphoretic. No erythema. No pallor.  Psychiatric: She has a normal mood and affect. Her behavior is normal. Judgment and thought content normal.          Assessment & Plan:   Problem List Items Addressed This Visit      Unprioritized   Esophageal reflux - Primary    Symptoms are most consistent with GERD. She is reluctant to take Zantac, so will change to Pantoprazole. Will request records on labs and other evaluation completed in the ED. Send testing for H. Pylori. Recommended referral to GI for evaluation of this and for screening colonoscopy, but she declines. Follow up in 2 weeks and as needed.    Relevant Medications      pantoprazole (PROTONIX) EC tablet   Other Relevant Orders      H. pylori breath test       Return in about 2 weeks (around 01/29/2014) for  Recheck.

## 2014-01-15 NOTE — Progress Notes (Signed)
Pre visit review using our clinic review tool, if applicable. No additional management support is needed unless otherwise documented below in the visit note. 

## 2014-01-15 NOTE — Assessment & Plan Note (Signed)
Symptoms are most consistent with GERD. She is reluctant to take Zantac, so will change to Pantoprazole. Will request records on labs and other evaluation completed in the ED. Send testing for H. Pylori. Recommended referral to GI for evaluation of this and for screening colonoscopy, but she declines. Follow up in 2 weeks and as needed.

## 2014-01-15 NOTE — Telephone Encounter (Signed)
Discussed with patient after visit today. Wants insurance kept "whole." Prefers being a patient of Dr. Gilford Rile.  Lorriane Shire is following up with insurance company as to correctly assigning to Dr. Gilford Rile.

## 2014-01-16 LAB — H. PYLORI BREATH TEST: H. PYLORI BREATH TEST: NOT DETECTED

## 2014-01-19 IMAGING — CR DG CHEST 2V
2 series · 2 of 2 positions shown · non-contrast
Comparison: None.

CLINICAL DATA: Cough for 3 weeks.

CHEST - 2 VIEW

[view not recorded (1 of 2)]
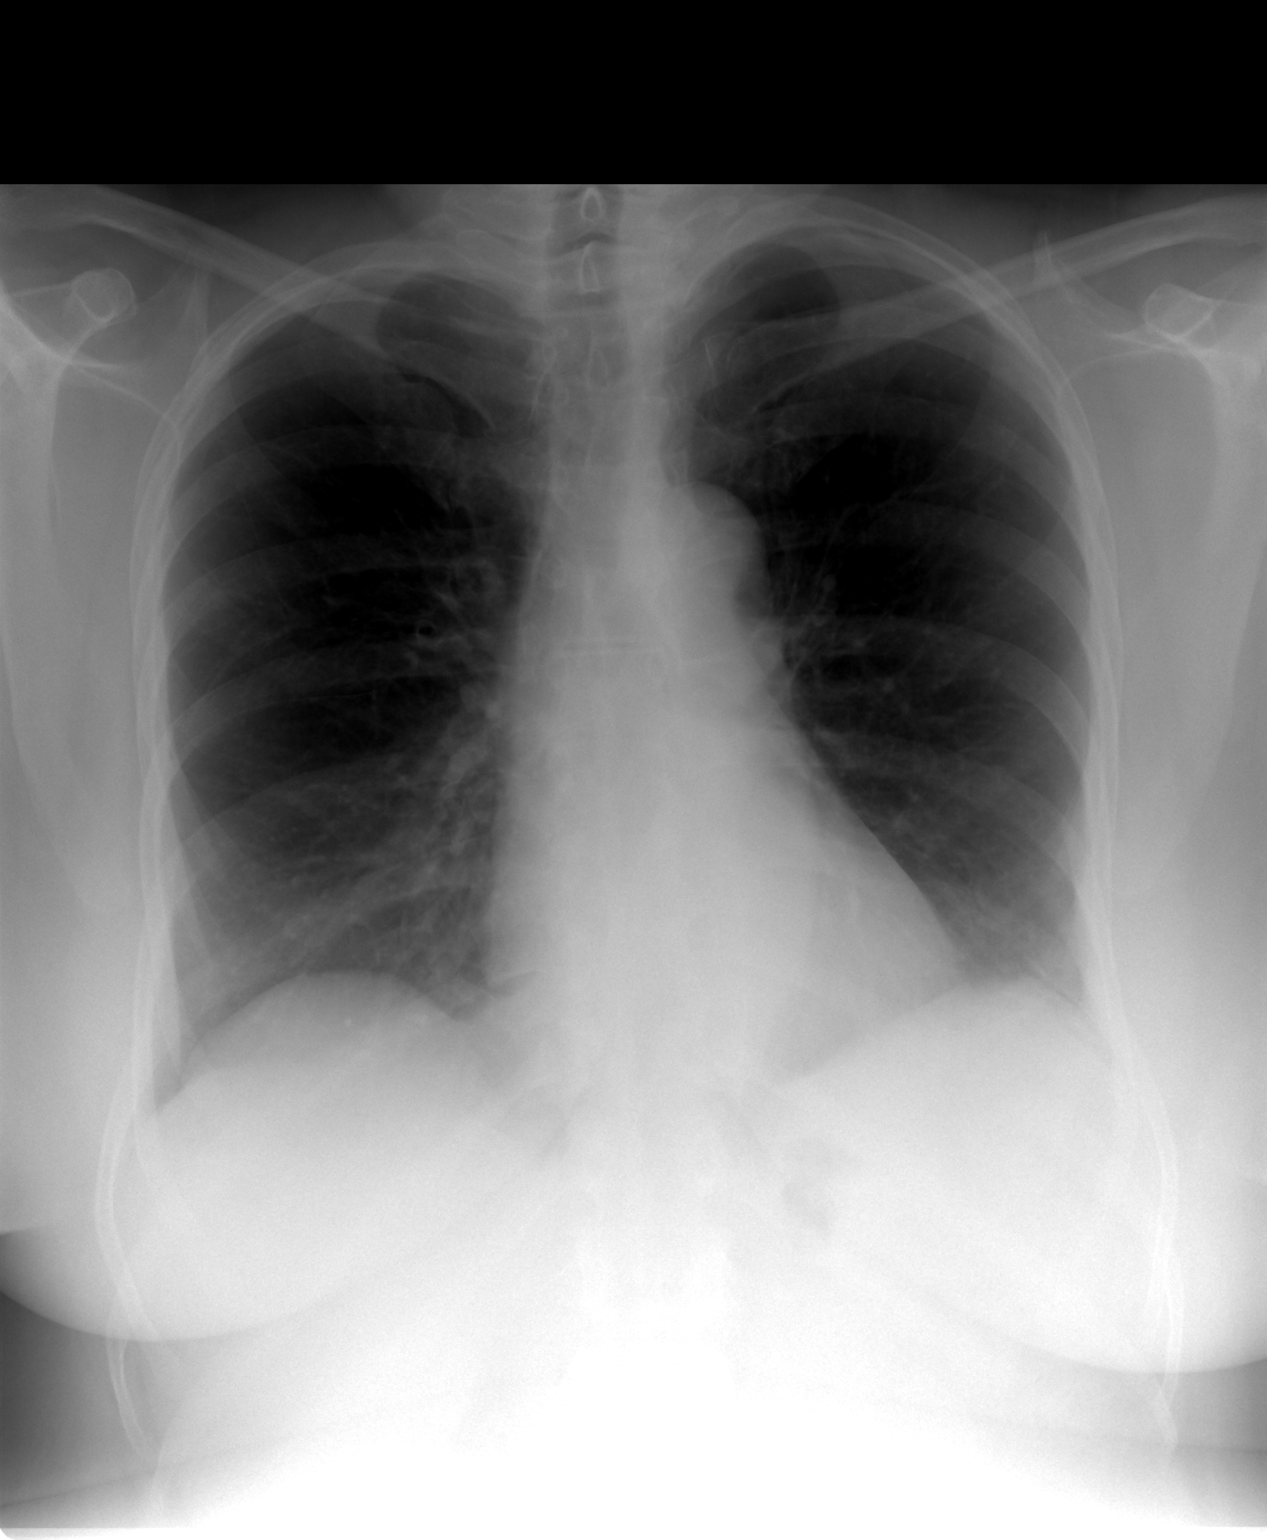

[view not recorded (2 of 2)]
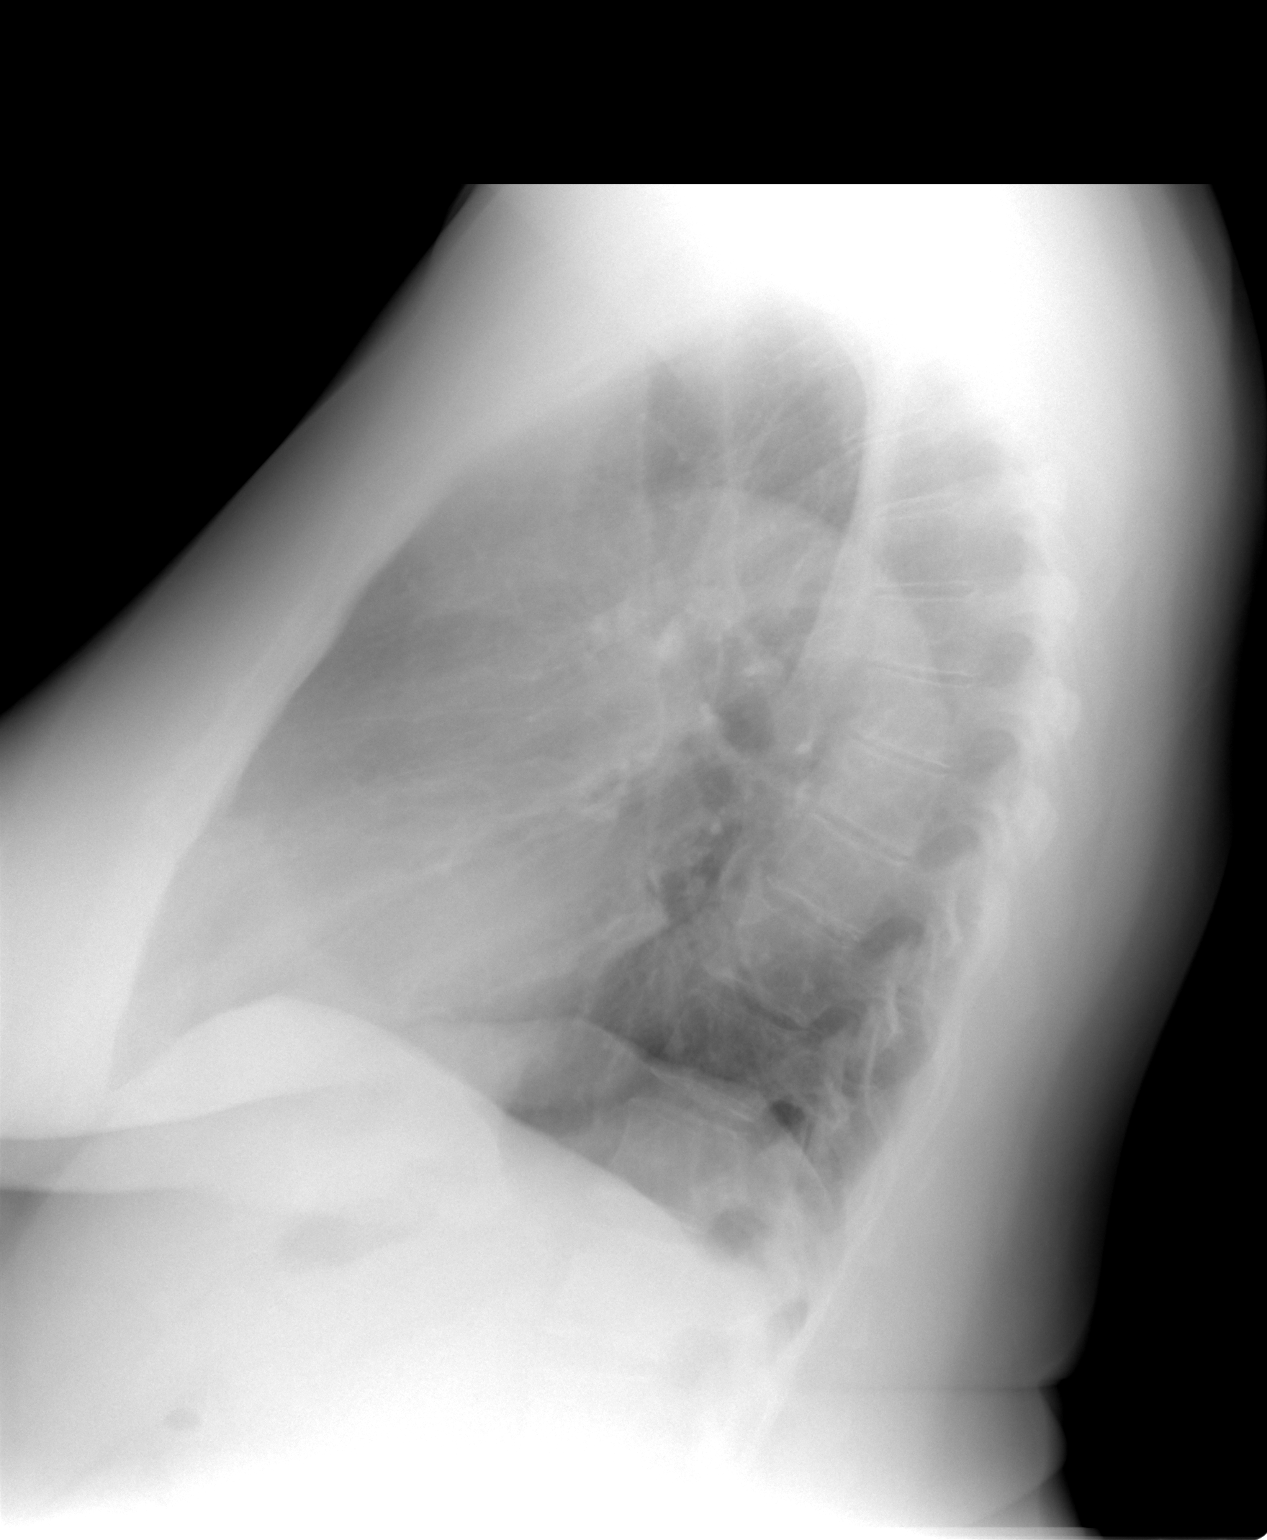

[2 of 2 positions shown; findings below may reference images not displayed]

FINDINGS: Two views of the chest were obtained.  The lungs are
clear bilaterally. Heart and mediastinum are within normal limits.
The trachea is midline.  Bony thorax is intact.
IMPRESSION: No acute cardiopulmonary disease.

## 2014-01-24 ENCOUNTER — Telehealth: Payer: Self-pay | Admitting: Internal Medicine

## 2014-01-24 NOTE — Telephone Encounter (Signed)
The patient is wanting to know if she should take her hydrochlorothiazide and her lisinopril together or separate.

## 2014-01-24 NOTE — Telephone Encounter (Signed)
It is fine to take them at the same time.

## 2014-01-24 NOTE — Telephone Encounter (Signed)
No voicemal. Sent mychart message

## 2014-02-12 ENCOUNTER — Encounter: Payer: Self-pay | Admitting: Internal Medicine

## 2014-02-12 ENCOUNTER — Ambulatory Visit (INDEPENDENT_AMBULATORY_CARE_PROVIDER_SITE_OTHER): Payer: Commercial Managed Care - HMO | Admitting: Internal Medicine

## 2014-02-12 VITALS — BP 120/80 | HR 92 | Temp 98.1°F | Ht 67.25 in | Wt 222.5 lb

## 2014-02-12 DIAGNOSIS — I10 Essential (primary) hypertension: Secondary | ICD-10-CM

## 2014-02-12 DIAGNOSIS — K219 Gastro-esophageal reflux disease without esophagitis: Secondary | ICD-10-CM

## 2014-02-12 MED ORDER — HYDROCHLOROTHIAZIDE 25 MG PO TABS: 25.0000 mg | ORAL_TABLET | Freq: Every day | ORAL | Status: DC

## 2014-02-12 MED ORDER — LISINOPRIL 5 MG PO TABS: 5.0000 mg | ORAL_TABLET | Freq: Every day | ORAL | Status: DC

## 2014-02-12 NOTE — Patient Instructions (Signed)
Try decreasing Pantoprazole to 40mg  once daily.

## 2014-02-12 NOTE — Progress Notes (Signed)
   Subjective:    Patient ID: Kylie Diaz, female    DOB: 1945/07/07, 68 y.o.   MRN: [id number]  HPI 68YO female presents for follow up of GERD.  GERD - Symptoms have resolved with Pantoprazole twice daily.  Feeling well. No concerns today.  Review of Systems  Constitutional: Negative for fever, chills, appetite change, fatigue and unexpected weight change.  Eyes: Negative for visual disturbance.  Respiratory: Negative for shortness of breath.   Cardiovascular: Negative for chest pain and leg swelling.  Gastrointestinal: Negative for nausea, vomiting, abdominal pain, diarrhea, constipation, blood in stool and abdominal distention.  Skin: Negative for color change and rash.  Hematological: Negative for adenopathy. Does not bruise/bleed easily.  Psychiatric/Behavioral: Negative for dysphoric mood. The patient is not nervous/anxious.        Objective:    BP 120/80 mmHg  Pulse 92  Temp(Src) 98.1 F (36.7 C) (Oral)  Ht 5' 7.25" (1.708 m)  Wt 222 lb 8 oz (100.925 kg)  BMI 34.60 kg/m2  SpO2 97% Physical Exam  Constitutional: She is oriented to person, place, and time. She appears well-developed and well-nourished. No distress.  HENT:  Head: Normocephalic and atraumatic.  Right Ear: External ear normal.  Left Ear: External ear normal.  Nose: Nose normal.  Mouth/Throat: Oropharynx is clear and moist. No oropharyngeal exudate.  Eyes: Conjunctivae are normal. Pupils are equal, round, and reactive to light. Right eye exhibits no discharge. Left eye exhibits no discharge. No scleral icterus.  Neck: Normal range of motion. Neck supple. No tracheal deviation present. No thyromegaly present.  Cardiovascular: Normal rate, regular rhythm, normal heart sounds and intact distal pulses.  Exam reveals no gallop and no friction rub.   No murmur heard. Pulmonary/Chest: Effort normal and breath sounds normal. No accessory muscle usage. No tachypnea. No respiratory distress. She has no  decreased breath sounds. She has no wheezes. She has no rhonchi. She has no rales. She exhibits no tenderness.  Musculoskeletal: Normal range of motion. She exhibits no edema or tenderness.  Lymphadenopathy:    She has no cervical adenopathy.  Neurological: She is alert and oriented to person, place, and time. No cranial nerve deficit. She exhibits normal muscle tone. Coordination normal.  Skin: Skin is warm and dry. No rash noted. She is not diaphoretic. No erythema. No pallor.  Psychiatric: She has a normal mood and affect. Her behavior is normal. Judgment and thought content normal.          Assessment & Plan:   Problem List Items Addressed This Visit      Unprioritized   Esophageal reflux - Primary    Symptoms resolved with use of pantoprazole. Will decrease dose to once daily. Follow up prn.    Hypertension    BP Readings from Last 3 Encounters:  02/12/14 120/80  01/15/14 134/80  04/28/13 140/100   BP well controlled on current medication. Will continue.    Relevant Medications      lisinopril (PRINIVIL,ZESTRIL) tablet      hydrochlorothiazide tablet       Return in about 8 weeks (around 04/09/2014) for Wellness Visit.

## 2014-02-12 NOTE — Assessment & Plan Note (Signed)
Symptoms resolved with use of pantoprazole. Will decrease dose to once daily. Follow up prn.

## 2014-02-12 NOTE — Assessment & Plan Note (Signed)
BP Readings from Last 3 Encounters:  02/12/14 120/80  01/15/14 134/80  04/28/13 140/100   BP well controlled on current medication. Will continue.

## 2014-02-12 NOTE — Progress Notes (Signed)
Pre visit review using our clinic review tool, if applicable. No additional management support is needed unless otherwise documented below in the visit note. 

## 2014-03-08 ENCOUNTER — Other Ambulatory Visit: Payer: Self-pay | Admitting: Internal Medicine

## 2014-03-15 ENCOUNTER — Encounter: Payer: Self-pay | Admitting: *Deleted

## 2014-04-17 ENCOUNTER — Encounter: Payer: Self-pay | Admitting: Internal Medicine

## 2014-04-17 ENCOUNTER — Ambulatory Visit (INDEPENDENT_AMBULATORY_CARE_PROVIDER_SITE_OTHER): Payer: Commercial Managed Care - HMO | Admitting: Internal Medicine

## 2014-04-17 VITALS — BP 114/75 | HR 86 | Temp 98.1°F | Ht 67.0 in | Wt 216.5 lb

## 2014-04-17 DIAGNOSIS — Z0000 Encounter for general adult medical examination without abnormal findings: Secondary | ICD-10-CM

## 2014-04-17 DIAGNOSIS — E669 Obesity, unspecified: Secondary | ICD-10-CM | POA: Diagnosis not present

## 2014-04-17 LAB — CBC WITH DIFFERENTIAL/PLATELET
BASOS ABS: 0 10*3/uL (ref 0.0–0.1)
Basophils Relative: 0.5 % (ref 0.0–3.0)
EOS ABS: 0.1 10*3/uL (ref 0.0–0.7)
Eosinophils Relative: 1.5 % (ref 0.0–5.0)
HCT: 33 % — ABNORMAL LOW (ref 36.0–46.0)
HEMOGLOBIN: 10.8 g/dL — AB (ref 12.0–15.0)
Lymphocytes Relative: 24.9 % (ref 12.0–46.0)
Lymphs Abs: 1.8 10*3/uL (ref 0.7–4.0)
MCHC: 32.9 g/dL (ref 30.0–36.0)
MCV: 84.2 fl (ref 78.0–100.0)
Monocytes Absolute: 0.5 10*3/uL (ref 0.1–1.0)
Monocytes Relative: 7.5 % (ref 3.0–12.0)
NEUTROS ABS: 4.7 10*3/uL (ref 1.4–7.7)
Neutrophils Relative %: 65.6 % (ref 43.0–77.0)
Platelets: 396 10*3/uL (ref 150.0–400.0)
RBC: 3.91 Mil/uL (ref 3.87–5.11)
RDW: 13.9 % (ref 11.5–15.5)
WBC: 7.2 10*3/uL (ref 4.0–10.5)

## 2014-04-17 LAB — LIPID PANEL
CHOL/HDL RATIO: 3
Cholesterol: 153 mg/dL (ref 0–200)
HDL: 53.5 mg/dL (ref >39.00)
LDL CALC: 83 mg/dL (ref 0–99)
NonHDL: 99.5
TRIGLYCERIDES: 85 mg/dL (ref 0.0–149.0)
VLDL: 17 mg/dL (ref 0.0–40.0)

## 2014-04-17 LAB — COMPREHENSIVE METABOLIC PANEL
ALT: 11 U/L (ref 0–35)
AST: 12 U/L (ref 0–37)
Albumin: 4.1 g/dL (ref 3.5–5.2)
Alkaline Phosphatase: 60 U/L (ref 39–117)
BUN: 10 mg/dL (ref 6–23)
CO2: 29 meq/L (ref 19–32)
CREATININE: 0.84 mg/dL (ref 0.40–1.20)
Calcium: 9.5 mg/dL (ref 8.4–10.5)
Chloride: 104 mEq/L (ref 96–112)
GFR: 86.82 mL/min (ref >60.00)
GLUCOSE: 79 mg/dL (ref 70–99)
Potassium: 3.9 mEq/L (ref 3.5–5.1)
Sodium: 140 mEq/L (ref 135–145)
Total Bilirubin: 0.6 mg/dL (ref 0.2–1.2)
Total Protein: 6.4 g/dL (ref 6.0–8.3)

## 2014-04-17 LAB — VITAMIN D 25 HYDROXY (VIT D DEFICIENCY, FRACTURES): VITD: 8.01 ng/mL — AB (ref 30.00–100.00)

## 2014-04-17 LAB — MICROALBUMIN / CREATININE URINE RATIO
Creatinine,U: 173.9 mg/dL
MICROALB/CREAT RATIO: 0.4 mg/g (ref 0.0–30.0)

## 2014-04-17 LAB — HEMOGLOBIN A1C: HEMOGLOBIN A1C: 5.7 % (ref 4.6–6.5)

## 2014-04-17 LAB — TSH: TSH: 1 u[IU]/mL (ref 0.35–4.50)

## 2014-04-17 LAB — HM COLONOSCOPY

## 2014-04-17 NOTE — Assessment & Plan Note (Signed)
General medical exam normal today including breast exam. PAP and pelvic deferred given pt age and previous normal PAP. Mammogram ordered. Pt declines colonoscopy, so discussed Cologuard testing, and will schedule. Pt declines flu and pneumonia vaccine. Will check labs including CBC, CMP, lipids, A1c, TSH, Vit D.

## 2014-04-17 NOTE — Patient Instructions (Signed)

## 2014-04-17 NOTE — Assessment & Plan Note (Signed)
Wt Readings from Last 3 Encounters:  04/17/14 216 lb 8 oz (98.204 kg)  02/12/14 222 lb 8 oz (100.925 kg)  01/15/14 218 lb 8 oz (99.111 kg)   Body mass index is 33.9 kg/(m^2). The patient is asked to make an attempt to improve diet and exercise patterns to aid in medical management of this problem.

## 2014-04-17 NOTE — Progress Notes (Signed)
Subjective:    Patient ID: Kylie Diaz, female    DOB: 04/24/1945, 69 y.o.   MRN: [id number]  HPI  The patient is here for annual Medicare wellness examination and management of other chronic and acute problems.   The risk factors are reflected in the social history.  The roster of all physicians providing medical care to patient - is listed in the Snapshot section of the chart.  Activities of daily living:  The patient is 100% independent in all ADLs: dressing, toileting, feeding as well as independent mobility. Lives with husband and dog, Tiffany. Pt is currently working as a Oceanographer.  Home safety : The patient has smoke detectors in the home. They wear seatbelts. There are no firearms at home. There is no violence in the home.   There is no risks for hepatitis, STDs or HIV. There is no history of blood transfusion. They have no travel history to infectious disease endemic areas of the world.  The patient has seen their dentist in the last six month. Dentist - does not currently have one They have seen their eye doctor in the last year. Opthalmologist - [facility]  No hearing issues. They have deferred audiologic testing in the last year.   They do not  have excessive sun exposure. Discussed the need for sun protection: hats, long sleeves and use of sunscreen if there is significant sun exposure. Dermatologist - None  Diet: the importance of a healthy diet is discussed. They do have a healthy diet.  The benefits of regular aerobic exercise were discussed. She exercises occasionally at [employer] through [employer].  Depression screen: there are no signs or vegative symptoms of depression- irritability, change in appetite, anhedonia, sadness/tearfullness.  Cognitive assessment: the patient manages all their financial and personal affairs and is actively engaged. They could relate day,date,year and events.  HCPOA - none in place at present, husband, Elicia Lui  The following portions of the patient's history were reviewed and updated as appropriate: allergies, current medications, past family history, past medical history,  past surgical history, past social history  and problem list.  Visual acuity was not assessed per patient preference since she has regular follow up with her ophthalmologist. Hearing and body mass index were assessed and reviewed.   During the course of the visit the patient was educated and counseled about appropriate screening and preventive services including : fall prevention , diabetes screening, nutrition counseling, colorectal cancer screening, and recommended immunizations.    Past medical, surgical, family and social history per today's encounter.  Review of Systems  Constitutional: Negative for fever, chills, appetite change, fatigue and unexpected weight change.  Eyes: Negative for visual disturbance.  Respiratory: Negative for shortness of breath.   Cardiovascular: Negative for chest pain and leg swelling.  Gastrointestinal: Negative for nausea, vomiting, abdominal pain, diarrhea, constipation and blood in stool.  Musculoskeletal: Negative for myalgias and arthralgias.  Skin: Negative for color change and rash.  Hematological: Negative for adenopathy. Does not bruise/bleed easily.  Psychiatric/Behavioral: Negative for sleep disturbance and dysphoric mood. The patient is not nervous/anxious.        Objective:    BP 114/75 mmHg  Pulse 86  Temp(Src) 98.1 F (36.7 C) (Oral)  Ht 5\' 7"  (1.702 m)  Wt 216 lb 8 oz (98.204 kg)  BMI 33.90 kg/m2  SpO2 97% Physical Exam  Constitutional: She is oriented to person, place, and time. She appears well-developed and well-nourished. No distress.  HENT:  Head: Normocephalic and atraumatic.  Right Ear: External ear normal.  Left Ear: External ear normal.  Nose: Nose normal.  Mouth/Throat: Oropharynx is clear and moist. No oropharyngeal exudate.  Eyes:  Conjunctivae are normal. Pupils are equal, round, and reactive to light. Right eye exhibits no discharge. Left eye exhibits no discharge. No scleral icterus.  Neck: Normal range of motion. Neck supple. No tracheal deviation present. No thyromegaly present.  Cardiovascular: Normal rate, regular rhythm, normal heart sounds and intact distal pulses.  Exam reveals no gallop and no friction rub.   No murmur heard. Pulmonary/Chest: Effort normal and breath sounds normal. No accessory muscle usage. No tachypnea. No respiratory distress. She has no decreased breath sounds. She has no wheezes. She has no rales. She exhibits no tenderness. Right breast exhibits no inverted nipple, no mass, no nipple discharge, no skin change and no tenderness. Left breast exhibits no inverted nipple, no mass, no nipple discharge, no skin change and no tenderness. Breasts are symmetrical.  Abdominal: Soft. Bowel sounds are normal. She exhibits no distension and no mass. There is no tenderness. There is no rebound and no guarding.  Musculoskeletal: Normal range of motion. She exhibits no edema or tenderness.  Lymphadenopathy:    She has no cervical adenopathy.  Neurological: She is alert and oriented to person, place, and time. No cranial nerve deficit. She exhibits normal muscle tone. Coordination normal.  Skin: Skin is warm and dry. No rash noted. She is not diaphoretic. No erythema. No pallor.  Psychiatric: She has a normal mood and affect. Her behavior is normal. Judgment and thought content normal.          Assessment & Plan:   Problem List Items Addressed This Visit      Unprioritized   Medicare annual wellness visit, subsequent - Primary    General medical exam normal today including breast exam. PAP and pelvic deferred given pt age and previous normal PAP. Mammogram ordered. Pt declines colonoscopy, so discussed Cologuard testing, and will schedule. Pt declines flu and pneumonia vaccine. Will check labs including  CBC, CMP, lipids, A1c, TSH, Vit D.      Relevant Orders   CBC with Differential/Platelet   Comprehensive metabolic panel   Lipid panel   Microalbumin / creatinine urine ratio   Vit D  25 hydroxy (rtn osteoporosis monitoring)   Hemoglobin A1c   TSH   MM Digital Screening   Obesity (BMI 30-39.9)    Wt Readings from Last 3 Encounters:  04/17/14 216 lb 8 oz (98.204 kg)  02/12/14 222 lb 8 oz (100.925 kg)  01/15/14 218 lb 8 oz (99.111 kg)   Body mass index is 33.9 kg/(m^2). The patient is asked to make an attempt to improve diet and exercise patterns to aid in medical management of this problem.           Return in about 6 months (around 10/16/2014) for Recheck.

## 2014-04-17 NOTE — Progress Notes (Signed)
Pre visit review using our clinic review tool, if applicable. No additional management support is needed unless otherwise documented below in the visit note. 

## 2014-04-27 ENCOUNTER — Ambulatory Visit: Payer: Self-pay | Admitting: Internal Medicine

## 2014-04-27 DIAGNOSIS — Z1231 Encounter for screening mammogram for malignant neoplasm of breast: Secondary | ICD-10-CM | POA: Diagnosis not present

## 2014-04-27 DIAGNOSIS — Z029 Encounter for administrative examinations, unspecified: Secondary | ICD-10-CM | POA: Diagnosis not present

## 2014-04-27 LAB — HM MAMMOGRAPHY

## 2014-05-03 ENCOUNTER — Encounter: Payer: Self-pay | Admitting: *Deleted

## 2014-05-22 ENCOUNTER — Other Ambulatory Visit: Payer: Self-pay | Admitting: Internal Medicine

## 2014-06-12 ENCOUNTER — Telehealth: Payer: Self-pay

## 2014-06-12 NOTE — Telephone Encounter (Signed)
The pt is hoping to get a [facility] ref in place for her eye apt on 3/30 [facility] in [place] - phone [phone]

## 2014-06-13 NOTE — Telephone Encounter (Signed)
Placed by Bonnita Nasuti.

## 2014-09-10 ENCOUNTER — Other Ambulatory Visit: Payer: Self-pay

## 2014-09-21 DIAGNOSIS — H04202 Unspecified epiphora, left lacrimal gland: Secondary | ICD-10-CM | POA: Diagnosis not present

## 2014-10-16 ENCOUNTER — Ambulatory Visit (INDEPENDENT_AMBULATORY_CARE_PROVIDER_SITE_OTHER): Payer: Commercial Managed Care - HMO | Admitting: Internal Medicine

## 2014-10-16 ENCOUNTER — Encounter: Payer: Self-pay | Admitting: Internal Medicine

## 2014-10-16 VITALS — BP 124/74 | HR 80 | Temp 98.1°F | Ht 67.0 in | Wt 220.0 lb

## 2014-10-16 DIAGNOSIS — H1012 Acute atopic conjunctivitis, left eye: Secondary | ICD-10-CM | POA: Diagnosis not present

## 2014-10-16 DIAGNOSIS — E669 Obesity, unspecified: Secondary | ICD-10-CM | POA: Diagnosis not present

## 2014-10-16 DIAGNOSIS — H1010 Acute atopic conjunctivitis, unspecified eye: Secondary | ICD-10-CM | POA: Insufficient documentation

## 2014-10-16 DIAGNOSIS — I10 Essential (primary) hypertension: Secondary | ICD-10-CM | POA: Diagnosis not present

## 2014-10-16 LAB — COMPREHENSIVE METABOLIC PANEL
ALT: 12 U/L (ref 0–35)
AST: 14 U/L (ref 0–37)
Albumin: 4 g/dL (ref 3.5–5.2)
Alkaline Phosphatase: 53 U/L (ref 39–117)
BUN: 8 mg/dL (ref 6–23)
CO2: 28 meq/L (ref 19–32)
CREATININE: 0.76 mg/dL (ref 0.40–1.20)
Calcium: 9.7 mg/dL (ref 8.4–10.5)
Chloride: 108 mEq/L (ref 96–112)
GFR: 97.31 mL/min (ref >60.00)
GLUCOSE: 90 mg/dL (ref 70–99)
POTASSIUM: 3.7 meq/L (ref 3.5–5.1)
Sodium: 141 mEq/L (ref 135–145)
TOTAL PROTEIN: 6.8 g/dL (ref 6.0–8.3)
Total Bilirubin: 0.5 mg/dL (ref 0.2–1.2)

## 2014-10-16 MED ORDER — OLOPATADINE HCL 0.2 % OP SOLN: 1.0000 [drp] | Freq: Every day | OPHTHALMIC | Status: DC | PRN

## 2014-10-16 NOTE — Assessment & Plan Note (Signed)
Recent left eye drainage. Exam normal today. Question if this may be related to intermittent allergies. Will start Pataday and Claritin. Follow up prn.

## 2014-10-16 NOTE — Progress Notes (Signed)
Subjective:    Patient ID: Kylie Diaz, female    DOB: 01/09/1946, 69 y.o.   MRN: [id number]  HPI  69YO female presents for follow up.  Concerned about weight gain. Notes some dietary indiscretion. Not exercising.  Wt Readings from Last 3 Encounters:  10/16/14 220 lb (99.791 kg)  04/17/14 216 lb 8 oz (98.204 kg)  02/12/14 222 lb 8 oz (100.925 kg)   Left eye drainage - Recently seen at [facility] for left eye irriation. Exam reportedly normal. Told to use OTC rewetting drops.  No improvement with this. Questions if this may be related to allergy symptoms.  Past medical, surgical, family and social history per today's encounter.  Review of Systems  Constitutional: Negative for fever, chills, appetite change, fatigue and unexpected weight change.  Eyes: Positive for discharge. Negative for pain, redness, itching and visual disturbance.  Respiratory: Negative for shortness of breath.   Cardiovascular: Negative for chest pain and leg swelling.  Gastrointestinal: Negative for abdominal pain, diarrhea and constipation.  Musculoskeletal: Negative for myalgias and arthralgias.  Skin: Negative for color change and rash.  Hematological: Negative for adenopathy. Does not bruise/bleed easily.  Psychiatric/Behavioral: Negative for dysphoric mood. The patient is not nervous/anxious.        Objective:    BP 124/74 mmHg  Pulse 80  Temp(Src) 98.1 F (36.7 C) (Oral)  Ht 5\' 7"  (1.702 m)  Wt 220 lb (99.791 kg)  BMI 34.45 kg/m2  SpO2 99% Physical Exam  Constitutional: She is oriented to person, place, and time. She appears well-developed and well-nourished. No distress.  HENT:  Head: Normocephalic and atraumatic.  Right Ear: External ear normal.  Left Ear: External ear normal.  Nose: Nose normal.  Mouth/Throat: Oropharynx is clear and moist. No oropharyngeal exudate.  Eyes: Conjunctivae and EOM are normal. Pupils are equal, round, and reactive to light. Right eye exhibits no  discharge. Left eye exhibits no discharge. Right conjunctiva is not injected. Left conjunctiva is not injected. No scleral icterus.  Neck: Normal range of motion. Neck supple. No tracheal deviation present. No thyromegaly present.  Cardiovascular: Normal rate, regular rhythm, normal heart sounds and intact distal pulses.  Exam reveals no gallop and no friction rub.   No murmur heard. Pulmonary/Chest: Effort normal and breath sounds normal. No respiratory distress. She has no wheezes. She has no rales. She exhibits no tenderness.  Musculoskeletal: Normal range of motion. She exhibits no edema or tenderness.  Lymphadenopathy:    She has no cervical adenopathy.  Neurological: She is alert and oriented to person, place, and time. No cranial nerve deficit. She exhibits normal muscle tone. Coordination normal.  Skin: Skin is warm and dry. No rash noted. She is not diaphoretic. No erythema. No pallor.  Psychiatric: She has a normal mood and affect. Her behavior is normal. Judgment and thought content normal.          Assessment & Plan:   Problem List Items Addressed This Visit      Unprioritized   Allergic conjunctivitis    Recent left eye drainage. Exam normal today. Question if this may be related to intermittent allergies. Will start Pataday and Claritin. Follow up prn.      Relevant Medications   Olopatadine HCl 0.2 % SOLN   Hypertension - Primary    BP Readings from Last 3 Encounters:  10/16/14 124/74  04/17/14 114/75  02/12/14 120/80   BP well controlled. Renal function with labs. Continue current medications.  Relevant Orders   Comprehensive metabolic panel   Obesity (BMI 30-39.9)    Wt Readings from Last 3 Encounters:  10/16/14 220 lb (99.791 kg)  04/17/14 216 lb 8 oz (98.204 kg)  02/12/14 222 lb 8 oz (100.925 kg)   Body mass index is 34.45 kg/(m^2). Encouraged healthy diet and exercise.           Return in about 6 months (around 04/18/2015) for Physical.

## 2014-10-16 NOTE — Progress Notes (Signed)
Pre visit review using our clinic review tool, if applicable. No additional management support is needed unless otherwise documented below in the visit note. 

## 2014-10-16 NOTE — Assessment & Plan Note (Signed)
BP Readings from Last 3 Encounters:  10/16/14 124/74  04/17/14 114/75  02/12/14 120/80   BP well controlled. Renal function with labs. Continue current medications.

## 2014-10-16 NOTE — Patient Instructions (Addendum)
Labs today.  Try adding Claritin 10mg  daily to help with allergy symptoms.  Start Pataday daily to help with eye irritation.  Follow up in 6 months.

## 2014-10-16 NOTE — Assessment & Plan Note (Signed)
Wt Readings from Last 3 Encounters:  10/16/14 220 lb (99.791 kg)  04/17/14 216 lb 8 oz (98.204 kg)  02/12/14 222 lb 8 oz (100.925 kg)   Body mass index is 34.45 kg/(m^2). Encouraged healthy diet and exercise.

## 2014-11-21 ENCOUNTER — Telehealth: Payer: Self-pay | Admitting: *Deleted

## 2014-11-21 NOTE — Telephone Encounter (Signed)
Patient has requested to have a change in blood pressure medication, patient has stated that the medication has a made her hair fall out. -Thanks

## 2014-11-22 NOTE — Telephone Encounter (Signed)
Attempted to call pt, no answer.  Left message on VM

## 2014-11-22 NOTE — Telephone Encounter (Signed)
Needs 30min visit.

## 2014-11-22 NOTE — Telephone Encounter (Signed)
Spoke with pt, scheduled 9.9.16 at 9am with Dr Gilford Rile.

## 2014-11-23 ENCOUNTER — Ambulatory Visit (INDEPENDENT_AMBULATORY_CARE_PROVIDER_SITE_OTHER): Payer: Commercial Managed Care - HMO | Admitting: Internal Medicine

## 2014-11-23 ENCOUNTER — Encounter: Payer: Self-pay | Admitting: Internal Medicine

## 2014-11-23 VITALS — BP 120/80 | HR 86 | Temp 98.2°F | Ht 67.0 in | Wt 220.0 lb

## 2014-11-23 DIAGNOSIS — F4323 Adjustment disorder with mixed anxiety and depressed mood: Secondary | ICD-10-CM | POA: Diagnosis not present

## 2014-11-23 DIAGNOSIS — L659 Nonscarring hair loss, unspecified: Secondary | ICD-10-CM | POA: Diagnosis not present

## 2014-11-23 DIAGNOSIS — I10 Essential (primary) hypertension: Secondary | ICD-10-CM

## 2014-11-23 MED ORDER — LOSARTAN POTASSIUM 25 MG PO TABS: 25.0000 mg | ORAL_TABLET | Freq: Every day | ORAL | Status: DC
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
## 2014-11-23 NOTE — Progress Notes (Signed)
Pre visit review using our clinic review tool, if applicable. No additional management support is needed unless otherwise documented below in the visit note. 

## 2014-11-23 NOTE — Patient Instructions (Signed)
Stop Lisinopril.  Start Losartan 25mg  daily.  Follow up for blood pressure recheck in 2 weeks.

## 2014-11-23 NOTE — Assessment & Plan Note (Signed)
BP Readings from Last 3 Encounters:  11/23/14 120/80  10/16/14 124/74  04/17/14 114/75   BP well controlled, however she is concerned about hair loss on Lisinopril. Discussed that this is likely not the cause, however will try change to Losartan. Recheck BP in 2 weeks with repeat Cr and K.

## 2014-11-23 NOTE — Assessment & Plan Note (Signed)
Hair loss with diffuse thinning. Recent TSH normal. Discussed that lisinopril likely not the cause, but will try change to Losartan. Discussed referral to dermatology if persistent.

## 2014-12-14 ENCOUNTER — Ambulatory Visit: Payer: Commercial Managed Care - HMO | Admitting: Internal Medicine

## 2014-12-21 ENCOUNTER — Ambulatory Visit: Payer: Commercial Managed Care - HMO | Admitting: Internal Medicine

## 2014-12-27 ENCOUNTER — Encounter: Payer: Self-pay | Admitting: Internal Medicine

## 2014-12-27 ENCOUNTER — Ambulatory Visit (INDEPENDENT_AMBULATORY_CARE_PROVIDER_SITE_OTHER): Payer: Commercial Managed Care - HMO | Admitting: Internal Medicine

## 2014-12-27 VITALS — BP 128/83 | HR 93 | Temp 98.0°F | Ht 67.0 in | Wt 217.4 lb

## 2014-12-27 DIAGNOSIS — F4323 Adjustment disorder with mixed anxiety and depressed mood: Secondary | ICD-10-CM | POA: Diagnosis not present

## 2014-12-27 DIAGNOSIS — I10 Essential (primary) hypertension: Secondary | ICD-10-CM

## 2014-12-27 MED ORDER — LOSARTAN POTASSIUM 25 MG PO TABS: 25.0000 mg | ORAL_TABLET | Freq: Every day | ORAL | Status: DC

## 2014-12-27 MED ORDER — HYDROCHLOROTHIAZIDE 25 MG PO TABS: 25.0000 mg | ORAL_TABLET | Freq: Every day | ORAL | Status: DC

## 2014-12-27 NOTE — Patient Instructions (Signed)
Continue current medications. 

## 2014-12-27 NOTE — Progress Notes (Signed)
Subjective:    Patient ID: Kylie Diaz, female    DOB: [birth year], 69 y.o.   MRN: [id number]  HPI  69YO female presents for follow up.  HTN - Changed to Losartan at last visit. Tolerating well.  No CP, HA, palpitations.  Depression - Continues to struggle with depressed mood. Feels now that counseling would be helpful. Would like to set this up. Not suicidal. Feels like she needs to "get things" out.   Wt Readings from Last 3 Encounters:  12/27/14 217 lb 6 oz (98.601 kg)  11/23/14 220 lb (99.791 kg)  10/16/14 220 lb (99.791 kg)   BP Readings from Last 3 Encounters:  12/27/14 128/83  11/23/14 120/80  10/16/14 124/74    Past Medical History  Diagnosis Date  . Hypertension    Family History  Problem Relation Age of Onset  . Cancer Mother     unsure type  . Dementia Mother   . Aneurysm Sister   . Cancer Sister   . Cancer Brother     STOMACH  . Diabetes Brother   . Diabetes Sister   . Heart disease Sister    Past Surgical History  Procedure Laterality Date  . Abdominal hysterectomy    . Vaginal delivery  2   Social History   Social History  . Marital Status: Married    Spouse Name: N/A  . Number of Children: N/A  . Years of Education: N/A   Social History Main Topics  . Smoking status: Former Smoker    Types: Cigarettes    Quit date: 11/03/1978  . Smokeless tobacco: None     Comment: 1980  . Alcohol Use: Yes     Comment: social  . Drug Use: No  . Sexual Activity:    Partners: Male    Birth Control/ Protection: Surgical     Comment: hysterectomy.   Other Topics Concern  . None   Social History Narrative   Lives in [place].      School - BA in [employer]      Work - Oceanographer    Review of Systems  Constitutional: Negative for fever, chills, appetite change, fatigue and unexpected weight change.  Eyes: Negative for visual disturbance.  Respiratory: Negative for shortness of breath.   Cardiovascular: Negative for chest  pain, palpitations and leg swelling.  Gastrointestinal: Negative for abdominal pain, diarrhea and constipation.  Skin: Negative for color change and rash.  Neurological: Negative for headaches.  Hematological: Negative for adenopathy. Does not bruise/bleed easily.  Psychiatric/Behavioral: Positive for dysphoric mood. Negative for suicidal ideas. The patient is nervous/anxious.        Objective:    BP 128/83 mmHg  Pulse 93  Temp(Src) 98 F (36.7 C) (Oral)  Ht 5\' 7"  (1.702 m)  Wt 217 lb 6 oz (98.601 kg)  BMI 34.04 kg/m2  SpO2 98% Physical Exam  Constitutional: She is oriented to person, place, and time. She appears well-developed and well-nourished. No distress.  HENT:  Head: Normocephalic and atraumatic.  Right Ear: External ear normal.  Left Ear: External ear normal.  Nose: Nose normal.  Mouth/Throat: Oropharynx is clear and moist. No oropharyngeal exudate.  Eyes: Conjunctivae are normal. Pupils are equal, round, and reactive to light. Right eye exhibits no discharge. Left eye exhibits no discharge. No scleral icterus.  Neck: Normal range of motion. Neck supple. No tracheal deviation present. No thyromegaly present.  Cardiovascular: Normal rate, regular rhythm, normal heart sounds and intact distal  pulses.  Exam reveals no gallop and no friction rub.   No murmur heard. Pulmonary/Chest: Effort normal and breath sounds normal. No respiratory distress. She has no wheezes. She has no rales. She exhibits no tenderness.  Musculoskeletal: Normal range of motion. She exhibits no edema or tenderness.  Lymphadenopathy:    She has no cervical adenopathy.  Neurological: She is alert and oriented to person, place, and time. No cranial nerve deficit. She exhibits normal muscle tone. Coordination normal.  Skin: Skin is warm and dry. No rash noted. She is not diaphoretic. No erythema. No pallor.  Psychiatric: Her speech is normal and behavior is normal. Judgment and thought content normal. Her  mood appears anxious. She exhibits a depressed mood. She expresses no suicidal ideation.          Assessment & Plan:   Problem List Items Addressed This Visit      Unprioritized   Adjustment disorder with mixed anxiety and depressed mood    Worsening symptoms of anxiety and depression. Will set up evaluation with psychology. Follow up here in 3 months and prn.      Relevant Orders   Ambulatory referral to Psychology   Hypertension - Primary    BP Readings from Last 3 Encounters:  12/27/14 128/83  11/23/14 120/80  10/16/14 124/74   BP well controlled on Losartan and HCTZ. Will monitor. Follow up in 3 months.      Relevant Medications   losartan (COZAAR) 25 MG tablet   hydrochlorothiazide (HYDRODIURIL) 25 MG tablet       Return in about 4 months (around 04/29/2015) for Wellness Visit.

## 2014-12-27 NOTE — Assessment & Plan Note (Signed)
Worsening symptoms of anxiety and depression. Will set up evaluation with psychology. Follow up here in 3 months and prn.

## 2014-12-27 NOTE — Progress Notes (Signed)
Pre visit review using our clinic review tool, if applicable. No additional management support is needed unless otherwise documented below in the visit note. 

## 2014-12-27 NOTE — Assessment & Plan Note (Signed)
BP Readings from Last 3 Encounters:  12/27/14 128/83  11/23/14 120/80  10/16/14 124/74   BP well controlled on Losartan and HCTZ. Will monitor. Follow up in 3 months.

## 2015-03-25 ENCOUNTER — Telehealth: Payer: Self-pay | Admitting: Internal Medicine

## 2015-03-25 NOTE — Telephone Encounter (Signed)
Pt medication is losartan (COZAAR) 25 MG tablet.

## 2015-03-25 NOTE — Telephone Encounter (Signed)
Also pt says if any other medications needs to be refilled please refill. Thank You!

## 2015-03-25 NOTE — Telephone Encounter (Signed)
Please advise patient, that I don't have access to the prescription numbers, I will need medication names for refills.

## 2015-03-25 NOTE — Telephone Encounter (Signed)
Pt called wanting to let Dr Gilford Rile know she received an email regarding a prescription number [id number] if pt needs a refill from [facility]. Call pt @ [phone]. Thank You!

## 2015-04-08 ENCOUNTER — Other Ambulatory Visit: Payer: Self-pay

## 2015-04-08 MED ORDER — LOSARTAN POTASSIUM 25 MG PO TABS: 25.0000 mg | ORAL_TABLET | Freq: Every day | ORAL | Status: DC

## 2015-04-11 ENCOUNTER — Encounter: Payer: Self-pay | Admitting: Internal Medicine

## 2015-04-11 ENCOUNTER — Telehealth: Payer: Self-pay | Admitting: *Deleted

## 2015-04-11 ENCOUNTER — Telehealth: Payer: Self-pay

## 2015-04-11 NOTE — Telephone Encounter (Signed)
Patient requested a call from Dr.Walker, she would not list a reason.  Contact [phone]

## 2015-04-11 NOTE — Telephone Encounter (Signed)
FYI: Pt c/o having to reschedule numerous times .

## 2015-05-01 ENCOUNTER — Ambulatory Visit (INDEPENDENT_AMBULATORY_CARE_PROVIDER_SITE_OTHER): Payer: Commercial Managed Care - HMO | Admitting: Internal Medicine

## 2015-05-01 ENCOUNTER — Ambulatory Visit: Payer: Commercial Managed Care - HMO | Admitting: Internal Medicine

## 2015-05-01 ENCOUNTER — Encounter: Payer: Commercial Managed Care - HMO | Admitting: Internal Medicine

## 2015-05-01 ENCOUNTER — Encounter: Payer: Self-pay | Admitting: Internal Medicine

## 2015-05-01 VITALS — BP 161/88 | HR 94 | Temp 97.8°F | Ht 67.0 in | Wt 223.1 lb

## 2015-05-01 DIAGNOSIS — Z1239 Encounter for other screening for malignant neoplasm of breast: Secondary | ICD-10-CM | POA: Diagnosis not present

## 2015-05-01 DIAGNOSIS — I10 Essential (primary) hypertension: Secondary | ICD-10-CM

## 2015-05-01 DIAGNOSIS — Z0000 Encounter for general adult medical examination without abnormal findings: Secondary | ICD-10-CM | POA: Diagnosis not present

## 2015-05-01 LAB — CBC WITH DIFFERENTIAL/PLATELET
BASOS ABS: 0 10*3/uL (ref 0.0–0.1)
BASOS PCT: 0.7 % (ref 0.0–3.0)
Eosinophils Absolute: 0.1 10*3/uL (ref 0.0–0.7)
Eosinophils Relative: 1.5 % (ref 0.0–5.0)
HEMATOCRIT: 34.7 % — AB (ref 36.0–46.0)
Hemoglobin: 11.3 g/dL — ABNORMAL LOW (ref 12.0–15.0)
LYMPHS ABS: 1.6 10*3/uL (ref 0.7–4.0)
Lymphocytes Relative: 22.5 % (ref 12.0–46.0)
MCHC: 32.5 g/dL (ref 30.0–36.0)
MCV: 85.3 fl (ref 78.0–100.0)
Monocytes Absolute: 0.5 10*3/uL (ref 0.1–1.0)
Monocytes Relative: 7.2 % (ref 3.0–12.0)
NEUTROS ABS: 5 10*3/uL (ref 1.4–7.7)
NEUTROS PCT: 68.1 % (ref 43.0–77.0)
Platelets: 399 10*3/uL (ref 150.0–400.0)
RBC: 4.06 Mil/uL (ref 3.87–5.11)
RDW: 14.2 % (ref 11.5–15.5)
WBC: 7.3 10*3/uL (ref 4.0–10.5)

## 2015-05-01 LAB — COMPREHENSIVE METABOLIC PANEL
ALK PHOS: 69 U/L (ref 39–117)
ALT: 9 U/L (ref 0–35)
AST: 10 U/L (ref 0–37)
Albumin: 4.2 g/dL (ref 3.5–5.2)
BUN: 13 mg/dL (ref 6–23)
CO2: 27 mEq/L (ref 19–32)
Calcium: 9.8 mg/dL (ref 8.4–10.5)
Chloride: 109 mEq/L (ref 96–112)
Creatinine, Ser: 0.82 mg/dL (ref 0.40–1.20)
GFR: 89 mL/min (ref >60.00)
Glucose, Bld: 96 mg/dL (ref 70–99)
POTASSIUM: 4.1 meq/L (ref 3.5–5.1)
SODIUM: 142 meq/L (ref 135–145)
TOTAL PROTEIN: 6.8 g/dL (ref 6.0–8.3)
Total Bilirubin: 0.5 mg/dL (ref 0.2–1.2)

## 2015-05-01 LAB — LIPID PANEL
CHOLESTEROL: 185 mg/dL (ref 0–200)
HDL: 56.6 mg/dL (ref >39.00)
LDL Cholesterol: 96 mg/dL (ref 0–99)
NonHDL: 128.23
TRIGLYCERIDES: 160 mg/dL — AB (ref 0.0–149.0)
Total CHOL/HDL Ratio: 3
VLDL: 32 mg/dL (ref 0.0–40.0)

## 2015-05-01 LAB — MICROALBUMIN / CREATININE URINE RATIO
Creatinine,U: 213.3 mg/dL
MICROALB UR: 0.7 mg/dL (ref 0.0–1.9)
Microalb Creat Ratio: 0.3 mg/g (ref 0.0–30.0)

## 2015-05-01 NOTE — Addendum Note (Signed)
Addended by: Kyra Manges on: 05/01/2015 11:29 AM   Modules accepted: Miquel Dunn

## 2015-05-01 NOTE — Assessment & Plan Note (Signed)
Mammogram ordered

## 2015-05-01 NOTE — Assessment & Plan Note (Signed)
General medical exam including breast exam. Mammogram ordered. She declines PAP, last PAP in 2013 normal. We discussed guidelines for cervical cancer screening.  She would like to reconsider next year. Labs today. Declines flu vaccine and pneumonia vaccines.

## 2015-05-01 NOTE — Progress Notes (Signed)
Subjective:    Patient ID: Kylie Diaz, female    DOB: October 08, 1945, 70 y.o.   MRN: [id number]  HPI  70YO female presents for physical exam.  Frustrated by health insurance issues and home situation. No physical complaints.  Past medical, surgical, family and social history per today's encounter.   Wt Readings from Last 3 Encounters:  05/01/15 223 lb 2 oz (101.209 kg)  12/27/14 217 lb 6 oz (98.601 kg)  11/23/14 220 lb (99.791 kg)   BP Readings from Last 3 Encounters:  05/01/15 161/88  12/27/14 128/83  11/23/14 120/80    Past Medical History  Diagnosis Date  . Hypertension    Family History  Problem Relation Age of Onset  . Cancer Mother     unsure type  . Dementia Mother   . Aneurysm Sister   . Cancer Sister   . Cancer Brother     STOMACH  . Diabetes Brother   . Diabetes Sister   . Heart disease Sister    Past Surgical History  Procedure Laterality Date  . Abdominal hysterectomy    . Vaginal delivery  2   Social History   Social History  . Marital Status: Married    Spouse Name: N/A  . Number of Children: N/A  . Years of Education: N/A   Social History Main Topics  . Smoking status: Former Smoker    Types: Cigarettes    Quit date: 11/03/1978  . Smokeless tobacco: None     Comment: 1980  . Alcohol Use: Yes     Comment: social  . Drug Use: No  . Sexual Activity:    Partners: Male    Birth Control/ Protection: Surgical     Comment: hysterectomy.   Other Topics Concern  . None   Social History Narrative   Lives in [place].      School - BA in [employer]      Work - Oceanographer    Review of Systems  Constitutional: Negative for fever, chills, appetite change, fatigue and unexpected weight change.  Eyes: Negative for visual disturbance.  Respiratory: Negative for shortness of breath.   Cardiovascular: Negative for chest pain and leg swelling.  Gastrointestinal: Negative for nausea, vomiting, abdominal pain, diarrhea and  constipation.  Musculoskeletal: Negative for myalgias and arthralgias.  Skin: Negative for color change and rash.  Hematological: Negative for adenopathy. Does not bruise/bleed easily.  Psychiatric/Behavioral: Positive for dysphoric mood. Negative for sleep disturbance. The patient is nervous/anxious.        Objective:    BP 161/88 mmHg  Pulse 94  Temp(Src) 97.8 F (36.6 C) (Oral)  Ht 5\' 7"  (1.702 m)  Wt 223 lb 2 oz (101.209 kg)  BMI 34.94 kg/m2  SpO2 98% Physical Exam  Constitutional: She is oriented to person, place, and time. She appears well-developed and well-nourished. No distress.  HENT:  Head: Normocephalic and atraumatic.  Right Ear: External ear normal.  Left Ear: External ear normal.  Nose: Nose normal.  Mouth/Throat: Oropharynx is clear and moist. No oropharyngeal exudate.  Eyes: Conjunctivae are normal. Pupils are equal, round, and reactive to light. Right eye exhibits no discharge. Left eye exhibits no discharge. No scleral icterus.  Neck: Normal range of motion. Neck supple. No tracheal deviation present. No thyromegaly present.  Cardiovascular: Normal rate, regular rhythm, normal heart sounds and intact distal pulses.  Exam reveals no gallop and no friction rub.   No murmur heard. Pulmonary/Chest: Effort normal and breath  sounds normal. No accessory muscle usage. No tachypnea. No respiratory distress. She has no decreased breath sounds. She has no wheezes. She has no rales. She exhibits no tenderness. Right breast exhibits no inverted nipple, no mass, no nipple discharge, no skin change and no tenderness. Left breast exhibits no inverted nipple, no mass, no nipple discharge, no skin change and no tenderness. Breasts are symmetrical.  Abdominal: Soft. Bowel sounds are normal. She exhibits no distension and no mass. There is no tenderness. There is no rebound and no guarding.  Musculoskeletal: Normal range of motion. She exhibits no edema or tenderness.    Lymphadenopathy:    She has no cervical adenopathy.  Neurological: She is alert and oriented to person, place, and time. No cranial nerve deficit. She exhibits normal muscle tone. Coordination normal.  Skin: Skin is warm and dry. No rash noted. She is not diaphoretic. No erythema. No pallor.  Psychiatric: She has a normal mood and affect. Her behavior is normal. Judgment and thought content normal.          Assessment & Plan:   Problem List Items Addressed This Visit      Unprioritized   Hypertension   Medicare annual wellness visit, subsequent   Relevant Orders   CBC with Differential/Platelet   Comprehensive metabolic panel   Lipid panel   Microalbumin / creatinine urine ratio   Routine general medical examination at a health care facility - Primary    General medical exam including breast exam. Mammogram ordered. She declines PAP, last PAP in 2013 normal. We discussed guidelines for cervical cancer screening.  She would like to reconsider next year. Labs today. Declines flu vaccine and pneumonia vaccines.      Screening for breast cancer    Mammogram ordered.      Relevant Orders   MM Digital Screening       Return in about 4 weeks (around 05/29/2015) for Recheck of Blood Pressure.

## 2015-05-01 NOTE — Assessment & Plan Note (Signed)
BP Readings from Last 3 Encounters:  05/01/15 161/88  12/27/14 128/83  11/23/14 120/80   BP elevated today, however she is upset. Will recheck in 4 weeks.

## 2015-05-01 NOTE — Patient Instructions (Signed)
Health Maintenance, Female Adopting a healthy lifestyle and getting preventive care can go a long way to promote health and wellness. Talk with your health care provider about what schedule of regular examinations is right for you. This is a good chance for you to check in with your provider about disease prevention and staying healthy. In between checkups, there are plenty of things you can do on your own. Experts have done a lot of research about which lifestyle changes and preventive measures are most likely to keep you healthy. Ask your health care provider for more information. WEIGHT AND DIET  Eat a healthy diet  Be sure to include plenty of vegetables, fruits, low-fat dairy products, and lean protein.  Do not eat a lot of foods high in solid fats, added sugars, or salt.  Get regular exercise. This is one of the most important things you can do for your health.  Most adults should exercise for at least 150 minutes each week. The exercise should increase your heart rate and make you sweat (moderate-intensity exercise).  Most adults should also do strengthening exercises at least twice a week. This is in addition to the moderate-intensity exercise.  Maintain a healthy weight  Body mass index (BMI) is a measurement that can be used to identify possible weight problems. It estimates body fat based on height and weight. Your health care provider can help determine your BMI and help you achieve or maintain a healthy weight.  For females 20 years of age and older:   A BMI below 18.5 is considered underweight.  A BMI of 18.5 to 24.9 is normal.  A BMI of 25 to 29.9 is considered overweight.  A BMI of 30 and above is considered obese.  Watch levels of cholesterol and blood lipids  You should start having your blood tested for lipids and cholesterol at 69 years of age, then have this test every 5 years.  You may need to have your cholesterol levels checked more often if:  Your lipid  or cholesterol levels are high.  You are older than 69 years of age.  You are at high risk for heart disease.  CANCER SCREENING   Lung Cancer  Lung cancer screening is recommended for adults 55-80 years old who are at high risk for lung cancer because of a history of smoking.  A yearly low-dose CT scan of the lungs is recommended for people who:  Currently smoke.  Have quit within the past 15 years.  Have at least a 30-pack-year history of smoking. A pack year is smoking an average of one pack of cigarettes a day for 1 year.  Yearly screening should continue until it has been 15 years since you quit.  Yearly screening should stop if you develop a health problem that would prevent you from having lung cancer treatment.  Breast Cancer  Practice breast self-awareness. This means understanding how your breasts normally appear and feel.  It also means doing regular breast self-exams. Let your health care provider know about any changes, no matter how small.  If you are in your 20s or 30s, you should have a clinical breast exam (CBE) by a health care provider every 1-3 years as part of a regular health exam.  If you are 40 or older, have a CBE every year. Also consider having a breast X-ray (mammogram) every year.  If you have a family history of breast cancer, talk to your health care provider about genetic screening.  If you   are at high risk for breast cancer, talk to your health care provider about having an MRI and a mammogram every year.  Breast cancer gene (BRCA) assessment is recommended for women who have family members with BRCA-related cancers. BRCA-related cancers include:  Breast.  Ovarian.  Tubal.  Peritoneal cancers.  Results of the assessment will determine the need for genetic counseling and BRCA1 and BRCA2 testing. Cervical Cancer Your health care provider may recommend that you be screened regularly for cancer of the pelvic organs (ovaries, uterus, and  vagina). This screening involves a pelvic examination, including checking for microscopic changes to the surface of your cervix (Pap test). You may be encouraged to have this screening done every 3 years, beginning at age 21.  For women ages 30-65, health care providers may recommend pelvic exams and Pap testing every 3 years, or they may recommend the Pap and pelvic exam, combined with testing for human papilloma virus (HPV), every 5 years. Some types of HPV increase your risk of cervical cancer. Testing for HPV may also be done on women of any age with unclear Pap test results.  Other health care providers may not recommend any screening for nonpregnant women who are considered low risk for pelvic cancer and who do not have symptoms. Ask your health care provider if a screening pelvic exam is right for you.  If you have had past treatment for cervical cancer or a condition that could lead to cancer, you need Pap tests and screening for cancer for at least 20 years after your treatment. If Pap tests have been discontinued, your risk factors (such as having a new sexual partner) need to be reassessed to determine if screening should resume. Some women have medical problems that increase the chance of getting cervical cancer. In these cases, your health care provider may recommend more frequent screening and Pap tests. Colorectal Cancer  This type of cancer can be detected and often prevented.  Routine colorectal cancer screening usually begins at 69 years of age and continues through 69 years of age.  Your health care provider may recommend screening at an earlier age if you have risk factors for colon cancer.  Your health care provider may also recommend using home test kits to check for hidden blood in the stool.  A small camera at the end of a tube can be used to examine your colon directly (sigmoidoscopy or colonoscopy). This is done to check for the earliest forms of colorectal  cancer.  Routine screening usually begins at age 50.  Direct examination of the colon should be repeated every 5-10 years through 69 years of age. However, you may need to be screened more often if early forms of precancerous polyps or small growths are found. Skin Cancer  Check your skin from head to toe regularly.  Tell your health care provider about any new moles or changes in moles, especially if there is a change in a mole's shape or color.  Also tell your health care provider if you have a mole that is larger than the size of a pencil eraser.  Always use sunscreen. Apply sunscreen liberally and repeatedly throughout the day.  Protect yourself by wearing long sleeves, pants, a wide-brimmed hat, and sunglasses whenever you are outside. HEART DISEASE, DIABETES, AND HIGH BLOOD PRESSURE   High blood pressure causes heart disease and increases the risk of stroke. High blood pressure is more likely to develop in:  People who have blood pressure in the high end   of the normal range (130-139/85-89 mm Hg).  People who are overweight or obese.  People who are African American.  If you are 38-23 years of age, have your blood pressure checked every 3-5 years. If you are 61 years of age or older, have your blood pressure checked every year. You should have your blood pressure measured twice--once when you are at a hospital or clinic, and once when you are not at a hospital or clinic. Record the average of the two measurements. To check your blood pressure when you are not at a hospital or clinic, you can use:  An automated blood pressure machine at a pharmacy.  A home blood pressure monitor.  If you are between 45 years and 39 years old, ask your health care provider if you should take aspirin to prevent strokes.  Have regular diabetes screenings. This involves taking a blood sample to check your fasting blood sugar level.  If you are at a normal weight and have a low risk for diabetes,  have this test once every three years after 68 years of age.  If you are overweight and have a high risk for diabetes, consider being tested at a younger age or more often. PREVENTING INFECTION  Hepatitis B  If you have a higher risk for hepatitis B, you should be screened for this virus. You are considered at high risk for hepatitis B if:  You were born in a country where hepatitis B is common. Ask your health care provider which countries are considered high risk.  Your parents were born in a high-risk country, and you have not been immunized against hepatitis B (hepatitis B vaccine).  You have HIV or AIDS.  You use needles to inject street drugs.  You live with someone who has hepatitis B.  You have had sex with someone who has hepatitis B.  You get hemodialysis treatment.  You take certain medicines for conditions, including cancer, organ transplantation, and autoimmune conditions. Hepatitis C  Blood testing is recommended for:  Everyone born from [birth year] through 1964.  Anyone with known risk factors for hepatitis C. Sexually transmitted infections (STIs)  You should be screened for sexually transmitted infections (STIs) including gonorrhea and chlamydia if:  You are sexually active and are younger than 69 years of age.  You are older than 69 years of age and your health care provider tells you that you are at risk for this type of infection.  Your sexual activity has changed since you were last screened and you are at an increased risk for chlamydia or gonorrhea. Ask your health care provider if you are at risk.  If you do not have HIV, but are at risk, it may be recommended that you take a prescription medicine daily to prevent HIV infection. This is called pre-exposure prophylaxis (PrEP). You are considered at risk if:  You are sexually active and do not regularly use condoms or know the HIV status of your partner(s).  You take drugs by injection.  You are sexually  active with a partner who has HIV. Talk with your health care provider about whether you are at high risk of being infected with HIV. If you choose to begin PrEP, you should first be tested for HIV. You should then be tested every 3 months for as long as you are taking PrEP.  PREGNANCY   If you are premenopausal and you may become pregnant, ask your health care provider about preconception counseling.  If you may  become pregnant, take 400 to 800 micrograms (mcg) of folic acid every day.  If you want to prevent pregnancy, talk to your health care provider about birth control (contraception). OSTEOPOROSIS AND MENOPAUSE   Osteoporosis is a disease in which the bones lose minerals and strength with aging. This can result in serious bone fractures. Your risk for osteoporosis can be identified using a bone density scan.  If you are 61 years of age or older, or if you are at risk for osteoporosis and fractures, ask your health care provider if you should be screened.  Ask your health care provider whether you should take a calcium or vitamin D supplement to lower your risk for osteoporosis.  Menopause may have certain physical symptoms and risks.  Hormone replacement therapy may reduce some of these symptoms and risks. Talk to your health care provider about whether hormone replacement therapy is right for you.  HOME CARE INSTRUCTIONS   Schedule regular health, dental, and eye exams.  Stay current with your immunizations.   Do not use any tobacco products including cigarettes, chewing tobacco, or electronic cigarettes.  If you are pregnant, do not drink alcohol.  If you are breastfeeding, limit how much and how often you drink alcohol.  Limit alcohol intake to no more than 1 drink per day for nonpregnant women. One drink equals 12 ounces of beer, 5 ounces of wine, or 1 ounces of hard liquor.  Do not use street drugs.  Do not share needles.  Ask your health care provider for help if  you need support or information about quitting drugs.  Tell your health care provider if you often feel depressed.  Tell your health care provider if you have ever been abused or do not feel safe at home.   This information is not intended to replace advice given to you by your health care provider. Make sure you discuss any questions you have with your health care provider.   Document Released: 09/15/2010 Document Revised: 03/23/2014 Document Reviewed: 02/01/2013 Elsevier Interactive Patient Education [employer].

## 2015-05-01 NOTE — Progress Notes (Signed)
Pre visit review using our clinic review tool, if applicable. No additional management support is needed unless otherwise documented below in the visit note. 

## 2015-05-08 ENCOUNTER — Ambulatory Visit: Payer: Commercial Managed Care - HMO

## 2015-05-10 ENCOUNTER — Ambulatory Visit: Payer: Commercial Managed Care - HMO

## 2015-05-17 ENCOUNTER — Ambulatory Visit: Payer: Commercial Managed Care - HMO

## 2015-06-07 ENCOUNTER — Ambulatory Visit: Payer: Commercial Managed Care - HMO | Attending: Internal Medicine

## 2015-09-18 ENCOUNTER — Ambulatory Visit: Payer: Commercial Managed Care - HMO | Admitting: Internal Medicine

## 2015-09-19 ENCOUNTER — Ambulatory Visit (INDEPENDENT_AMBULATORY_CARE_PROVIDER_SITE_OTHER): Payer: Commercial Managed Care - HMO | Admitting: Internal Medicine

## 2015-09-19 VITALS — BP 138/76 | HR 88 | Ht 67.0 in | Wt 221.4 lb

## 2015-09-19 DIAGNOSIS — I10 Essential (primary) hypertension: Secondary | ICD-10-CM

## 2015-09-19 DIAGNOSIS — Z1159 Encounter for screening for other viral diseases: Secondary | ICD-10-CM

## 2015-09-19 DIAGNOSIS — D649 Anemia, unspecified: Secondary | ICD-10-CM

## 2015-09-19 NOTE — Progress Notes (Signed)
Subjective:    Patient ID: Kylie Diaz, female    DOB: 12-10-1945, 70 y.o.   MRN: [id number]  HPI  70YO female presents for follow up.  Feeling well. No concerns today.  HTN - Compliant with medication. No CP, dyspnea, palpitations.  Wt Readings from Last 3 Encounters:  09/19/15 221 lb 6.4 oz (100.426 kg)  05/01/15 223 lb 2 oz (101.209 kg)  12/27/14 217 lb 6 oz (98.601 kg)   BP Readings from Last 3 Encounters:  09/19/15 138/76  05/01/15 161/88  12/27/14 128/83    Past Medical History  Diagnosis Date  . Hypertension    Family History  Problem Relation Age of Onset  . Cancer Mother     unsure type  . Dementia Mother   . Aneurysm Sister   . Cancer Sister   . Cancer Brother     STOMACH  . Diabetes Brother   . Diabetes Sister   . Heart disease Sister    Past Surgical History  Procedure Laterality Date  . Abdominal hysterectomy    . Vaginal delivery  2   Social History   Social History  . Marital Status: Married    Spouse Name: N/A  . Number of Children: N/A  . Years of Education: N/A   Social History Main Topics  . Smoking status: Former Smoker    Types: Cigarettes    Quit date: 11/03/1978  . Smokeless tobacco: Not on file     Comment: 1980  . Alcohol Use: Yes     Comment: social  . Drug Use: No  . Sexual Activity:    Partners: Male    Birth Control/ Protection: Surgical     Comment: hysterectomy.   Other Topics Concern  . Not on file   Social History Narrative   Lives in [place].      School - BA in [employer]      Work - Oceanographer    Review of Systems  Constitutional: Negative for fever, chills, appetite change, fatigue and unexpected weight change.  Eyes: Negative for visual disturbance.  Respiratory: Negative for shortness of breath.   Cardiovascular: Negative for chest pain and leg swelling.  Gastrointestinal: Negative for nausea, vomiting, abdominal pain, diarrhea and constipation.  Skin: Negative for color  change and rash.  Hematological: Negative for adenopathy. Does not bruise/bleed easily.  Psychiatric/Behavioral: Negative for suicidal ideas, sleep disturbance and dysphoric mood. The patient is not nervous/anxious.        Objective:    BP 138/76 mmHg  Pulse 88  Ht 5\' 7"  (1.702 m)  Wt 221 lb 6.4 oz (100.426 kg)  BMI 34.67 kg/m2  SpO2 98% Physical Exam  Constitutional: She is oriented to person, place, and time. She appears well-developed and well-nourished. No distress.  HENT:  Head: Normocephalic and atraumatic.  Right Ear: External ear normal.  Left Ear: External ear normal.  Nose: Nose normal.  Mouth/Throat: Oropharynx is clear and moist. No oropharyngeal exudate.  Eyes: Conjunctivae are normal. Pupils are equal, round, and reactive to light. Right eye exhibits no discharge. Left eye exhibits no discharge. No scleral icterus.  Neck: Normal range of motion. Neck supple. No tracheal deviation present. No thyromegaly present.  Cardiovascular: Normal rate, regular rhythm, normal heart sounds and intact distal pulses.  Exam reveals no gallop and no friction rub.   No murmur heard. Pulmonary/Chest: Effort normal and breath sounds normal. No respiratory distress. She has no wheezes. She has no rales. She  exhibits no tenderness.  Musculoskeletal: Normal range of motion. She exhibits no edema or tenderness.  Lymphadenopathy:    She has no cervical adenopathy.  Neurological: She is alert and oriented to person, place, and time. No cranial nerve deficit. She exhibits normal muscle tone. Coordination normal.  Skin: Skin is warm and dry. No rash noted. She is not diaphoretic. No erythema. No pallor.  Psychiatric: She has a normal mood and affect. Her behavior is normal. Judgment and thought content normal.          Assessment & Plan:   Problem List Items Addressed This Visit      Unprioritized   Anemia    Will repeat CBC today to recheck Hgb. Last counts showed slight improvement  in chronic anemia.      Relevant Orders   CBC with Differential/Platelet   Hypertension - Primary    BP Readings from Last 3 Encounters:  09/19/15 138/76  05/01/15 161/88  12/27/14 128/83   BP well controlled. Renal function with labs today. Continue Losartan and HCTZ.      Relevant Orders   Comprehensive metabolic panel    Other Visit Diagnoses    Need for hepatitis C screening test        Relevant Orders    Hepatitis C antibody        Return in about 4 weeks (around 10/17/2015) for New Patient.  Ronette Deter, MD [facility]

## 2015-09-19 NOTE — Assessment & Plan Note (Signed)
BP Readings from Last 3 Encounters:  09/19/15 138/76  05/01/15 161/88  12/27/14 128/83   BP well controlled. Renal function with labs today. Continue Losartan and HCTZ.

## 2015-09-19 NOTE — Progress Notes (Signed)
Pre visit review using our clinic review tool, if applicable. No additional management support is needed unless otherwise documented below in the visit note. 

## 2015-09-19 NOTE — Assessment & Plan Note (Signed)
Will repeat CBC today to recheck Hgb. Last counts showed slight improvement in chronic anemia.

## 2015-09-19 NOTE — Patient Instructions (Signed)
Labs today

## 2015-09-20 ENCOUNTER — Telehealth: Payer: Self-pay

## 2015-09-20 NOTE — Addendum Note (Signed)
Addended by: Frutoso Chase A on: 09/20/2015 01:26 PM   Modules accepted: Orders, SmartSet

## 2015-09-20 NOTE — Telephone Encounter (Signed)
Error

## 2015-09-23 ENCOUNTER — Other Ambulatory Visit (INDEPENDENT_AMBULATORY_CARE_PROVIDER_SITE_OTHER): Payer: Commercial Managed Care - HMO

## 2015-09-23 DIAGNOSIS — Z1159 Encounter for screening for other viral diseases: Secondary | ICD-10-CM | POA: Diagnosis not present

## 2015-09-23 DIAGNOSIS — D649 Anemia, unspecified: Secondary | ICD-10-CM | POA: Diagnosis not present

## 2015-09-23 DIAGNOSIS — I10 Essential (primary) hypertension: Secondary | ICD-10-CM | POA: Diagnosis not present

## 2015-09-23 LAB — CBC WITH DIFFERENTIAL/PLATELET
Basophils Absolute: 0 10*3/uL (ref 0.0–0.1)
Basophils Relative: 0.3 % (ref 0.0–3.0)
EOS ABS: 0.1 10*3/uL (ref 0.0–0.7)
Eosinophils Relative: 1.3 % (ref 0.0–5.0)
HCT: 34.8 % — ABNORMAL LOW (ref 36.0–46.0)
HEMOGLOBIN: 11.3 g/dL — AB (ref 12.0–15.0)
LYMPHS ABS: 2.1 10*3/uL (ref 0.7–4.0)
Lymphocytes Relative: 23.7 % (ref 12.0–46.0)
MCHC: 32.4 g/dL (ref 30.0–36.0)
MCV: 86.1 fl (ref 78.0–100.0)
MONO ABS: 0.7 10*3/uL (ref 0.1–1.0)
Monocytes Relative: 7.6 % (ref 3.0–12.0)
NEUTROS PCT: 67.1 % (ref 43.0–77.0)
Neutro Abs: 5.9 10*3/uL (ref 1.4–7.7)
Platelets: 372 10*3/uL (ref 150.0–400.0)
RBC: 4.04 Mil/uL (ref 3.87–5.11)
RDW: 14.3 % (ref 11.5–15.5)
WBC: 8.8 10*3/uL (ref 4.0–10.5)

## 2015-09-23 LAB — COMPREHENSIVE METABOLIC PANEL
ALBUMIN: 4.1 g/dL (ref 3.5–5.2)
ALT: 13 U/L (ref 0–35)
AST: 16 U/L (ref 0–37)
Alkaline Phosphatase: 61 U/L (ref 39–117)
BUN: 10 mg/dL (ref 6–23)
CHLORIDE: 105 meq/L (ref 96–112)
CO2: 27 mEq/L (ref 19–32)
CREATININE: 0.76 mg/dL (ref 0.40–1.20)
Calcium: 9.9 mg/dL (ref 8.4–10.5)
GFR: 97.04 mL/min (ref >60.00)
GLUCOSE: 103 mg/dL — AB (ref 70–99)
POTASSIUM: 3.9 meq/L (ref 3.5–5.1)
SODIUM: 140 meq/L (ref 135–145)
Total Bilirubin: 0.6 mg/dL (ref 0.2–1.2)
Total Protein: 7 g/dL (ref 6.0–8.3)

## 2015-09-24 LAB — HEPATITIS C ANTIBODY: HCV AB: NEGATIVE

## 2015-10-24 ENCOUNTER — Ambulatory Visit: Payer: Self-pay | Admitting: Podiatry

## 2015-12-12 ENCOUNTER — Ambulatory Visit: Payer: Commercial Managed Care - HMO | Admitting: Internal Medicine

## 2015-12-17 ENCOUNTER — Ambulatory Visit: Payer: Commercial Managed Care - HMO | Admitting: Internal Medicine

## 2015-12-23 ENCOUNTER — Ambulatory Visit: Payer: Commercial Managed Care - HMO

## 2016-02-05 ENCOUNTER — Telehealth: Payer: Self-pay | Admitting: Internal Medicine

## 2016-02-05 NOTE — Telephone Encounter (Signed)
Pt called in regards to a No Show for DOS 12/17/15. I asked pt if she had cancelled her appt the day of and told me that it doe not matter when she cancels her appt as long as she cancels it. I explained to pt that the office policy is to cancel 24 hours prior to the appt to assure that she does not receive a no show fee. Pt was getting very upset and asked Lorriane Shire to speak with the patient.

## 2016-02-05 NOTE — Telephone Encounter (Signed)
I spoke to the patient she stated she was very well aware of the policy and that she never had one when Dr. Gilford Rile was here and she did not know who was doing it now . I started to explain the policy and she hung up . I called the patient back to explain the policy she again stated the same information as mentioned above and hung the phone up again.

## 2016-03-03 DIAGNOSIS — I10 Essential (primary) hypertension: Secondary | ICD-10-CM | POA: Diagnosis not present

## 2016-03-03 DIAGNOSIS — Z0100 Encounter for examination of eyes and vision without abnormal findings: Secondary | ICD-10-CM | POA: Diagnosis not present

## 2016-03-27 ENCOUNTER — Ambulatory Visit (INDEPENDENT_AMBULATORY_CARE_PROVIDER_SITE_OTHER): Payer: Commercial Managed Care - HMO | Admitting: Internal Medicine

## 2016-03-27 ENCOUNTER — Encounter: Payer: Self-pay | Admitting: Internal Medicine

## 2016-03-27 VITALS — BP 140/88 | HR 95 | Temp 98.0°F | Ht 67.0 in | Wt 224.2 lb

## 2016-03-27 DIAGNOSIS — E669 Obesity, unspecified: Secondary | ICD-10-CM | POA: Diagnosis not present

## 2016-03-27 DIAGNOSIS — Z0000 Encounter for general adult medical examination without abnormal findings: Secondary | ICD-10-CM

## 2016-03-27 DIAGNOSIS — I10 Essential (primary) hypertension: Secondary | ICD-10-CM

## 2016-03-27 DIAGNOSIS — D649 Anemia, unspecified: Secondary | ICD-10-CM | POA: Diagnosis not present

## 2016-03-27 NOTE — Patient Instructions (Addendum)
  Kylie Diaz , Thank you for taking time to come for your Medicare Wellness Visit. I appreciate your ongoing commitment to your health goals. Please review the following plan we discussed and let me know if I can assist you in the future.   These are the goals we discussed: Goals    . Increase physical activity          Walk for exercise as tolerated Join the gym 3 days weekly, 30 minute sessions       This is a list of the screening recommended for you and due dates:  Health Maintenance  Topic Date Due  . Shingles Vaccine  09/22/2006  . DEXA scan (bone density measurement)  09/22/2011  . Pneumonia vaccines (1 of 2 - PCV13) 09/22/2011  . Flu Shot  04/09/2017*  . Mammogram  04/27/2016  . Tetanus Vaccine  03/23/2019  . Colon Cancer Screening  04/17/2024  .  Hepatitis C: One time screening is recommended by Center for Disease Control  (CDC) for  adults born from [birth year] through 1964.   Completed  *Topic was postponed. The date shown is not the original due date.

## 2016-03-27 NOTE — Progress Notes (Signed)
Pre visit review using our clinic review tool, if applicable. No additional management support is needed unless otherwise documented below in the visit note. 

## 2016-03-27 NOTE — Progress Notes (Addendum)
Subjective:   Kylie Diaz is a 71 y.o. female who presents for Medicare Annual (Subsequent) preventive examination.  Review of Systems:  No ROS.  Medicare Wellness Visit.  Cardiac Risk Factors include: advanced age (>4men, >73 women);hypertension;obesity (BMI >30kg/m2)     Objective:     Vitals: BP 140/88   Pulse 95   Temp 98 F (36.7 C) (Oral)   Ht 5\' 7"  (1.702 m)   Wt 224 lb 3.2 oz (101.7 kg)   SpO2 96%   BMI 35.11 kg/m   Body mass index is 35.11 kg/m.   Tobacco History  Smoking Status  . Former Smoker  . Types: Cigarettes  . Quit date: 11/03/1978  Smokeless Tobacco  . Never Used    Comment: 1980     Counseling given: Not Answered   Past Medical History:  Diagnosis Date  . Hypertension    Past Surgical History:  Procedure Laterality Date  . ABDOMINAL HYSTERECTOMY    . VAGINAL DELIVERY  2   Family History  Problem Relation Age of Onset  . Cancer Mother     unsure type  . Dementia Mother   . Aneurysm Sister   . Cancer Sister   . Cancer Brother     STOMACH  . Diabetes Brother   . Diabetes Sister   . Heart disease Sister    History  Sexual Activity  . Sexual activity: Not Currently  . Partners: Male  . Birth control/ protection: Surgical    Comment: hysterectomy.    Outpatient Encounter Prescriptions as of 03/27/2016  Medication Sig  . Calcium Carbonate-Vitamin D (CALCIUM-VITAMIN D) 500-200 MG-UNIT per tablet Take 2 tablets by mouth 3 (three) times daily with meals.  . hydrochlorothiazide (HYDRODIURIL) 25 MG tablet Take 1 tablet (25 mg total) by mouth daily.  Marland Kitchen losartan (COZAAR) 25 MG tablet Take 1 tablet (25 mg total) by mouth daily.  . [DISCONTINUED] Olopatadine HCl 0.2 % SOLN Apply 1 drop to eye daily as needed.  . [DISCONTINUED] pantoprazole (PROTONIX) 40 MG tablet Take 1 tablet (40 mg total) by mouth 2 (two) times daily.   No facility-administered encounter medications on file as of 03/27/2016.     Activities of Daily Living In  your present state of health, do you have any difficulty performing the following activities: 03/27/2016  Hearing? N  Vision? N  Difficulty concentrating or making decisions? N  Walking or climbing stairs? N  Dressing or bathing? N  Doing errands, shopping? N  Preparing Food and eating ? N  Using the Toilet? N  In the past six months, have you accidently leaked urine? Y  Do you have problems with loss of bowel control? N  Managing your Medications? N  Managing your Finances? N  Housekeeping or managing your Housekeeping? N  Some recent data might be hidden    Patient Care Team: Einar Pheasant, MD as PCP - General (Internal Medicine)    Assessment:    This is a routine wellness examination for Naw. The goal of the wellness visit is to assist the patient how to close the gaps in care and create a preventative care plan for the patient.   Taking calcium VIT D as appropriate/Osteoporosis risk reviewed.  DEXA SCAN deferred for follow up at a later date per patient request.  Educational material provided.  Medications reviewed; taking without issues or barriers.  Safety issues reviewed; smoke detectors in the home. No firearms in the home. Wears seatbelts when driving or riding with  others. No violence in the home.  No identified risk were noted; The patient was oriented x 3; appropriate in dress and manner and no objective failures at ADL's or IADL's.   BMI; discussed the importance of a healthy diet, water intake and exercise. Educational material provided.  HTN; followed by PCP.  Patient Concerns: None at this time. Follow up with PCP as needed.  Exercise Activities and Dietary recommendations Current Exercise Habits: The patient does not participate in regular exercise at present  Goals    . Increase physical activity          Walk for exercise as tolerated Join the gym 3 days weekly, 30 minute sessions      Fall Risk Fall Risk  03/27/2016 09/19/2015 04/17/2014  04/11/2013 04/04/2012  Falls in the past year? No No No No No   Depression Screen PHQ 2/9 Scores 03/27/2016 09/19/2015 04/17/2014 04/11/2013  PHQ - 2 Score 2 0 0 0     Cognitive Function     6CIT Screen 03/27/2016  What Year? 0 points  What month? 0 points  What time? 0 points  Count back from 20 0 points  Months in reverse 0 points    Immunization History  Administered Date(s) Administered  . Tdap 03/22/2009   Screening Tests Health Maintenance  Topic Date Due  . ZOSTAVAX  09/22/2006  . DEXA SCAN  09/22/2011  . PNA vac Low Risk Adult (1 of 2 - PCV13) 09/22/2011  . INFLUENZA VACCINE  04/09/2017 (Originally 10/15/2015)  . MAMMOGRAM  04/27/2016  . TETANUS/TDAP  03/23/2019  . COLONOSCOPY  04/17/2024  . Hepatitis C Screening  Completed      Plan:    End of life planning; Advance aging; Advanced directives discussed. No HCPOA/Living Will.  Additional information provided to help her start the conversation wit her family.  Copy of HCPOA/Living Will short forms requested upon completion.  Time spent on this topic is 28 minutes.  Medicare Attestation I have personally reviewed: The patient's medical and social history Their use of alcohol, tobacco or illicit drugs Their current medications and supplements The patient's functional ability including ADLs,fall risks, home safety risks, cognitive, and hearing and visual impairment Diet and physical activities Evidence for depression   The patient's weight, height, BMI, and visual acuity have been recorded in the chart.  I have made referrals and provided education to the patient based on review of the above and I have provided the patient with a written personalized care plan for preventive services.     During the course of the visit the patient was educated and counseled about the following appropriate screening and preventive services:   Vaccines to include Pneumoccal, Influenza, Hepatitis B, Td, Zostavax,  HCV  Electrocardiogram  Cardiovascular Disease  Colorectal cancer screening  Bone density screening  Diabetes screening  Glaucoma screening  Mammography/PAP  Nutrition counseling   Patient Instructions (the written plan) was given to the patient.   Varney Biles, LPN  [id number]   Reviewed above information.  Agree with plan.  Dr Nicki Reaper

## 2016-03-27 NOTE — Progress Notes (Signed)
Patient ID: Kylie Diaz, female   DOB: [birth year], 71 y.o.   MRN: [id number]   Subjective:    Patient ID: Kylie Diaz, female    DOB: Oct 28, 1945, 71 y.o.   MRN: [id number]  HPI  Patient here to establish care.  Former pt of Dr Gilford Rile.  She has a history of hypertension.  On losartan.  Apparently on taking hctz a couple of times per week.  Tries to stay active.  Discussed exercise.  Increased stress with her home situation.  Overall she feels she is handling things relatively well.  No chest pain.  No sob.  No acid reflux.  No abdominal pain or cramping.  Bowels stable.  Is s/p hysterectomy.  No history of abnormal pap smear.  Had a history of fibroid.     Past Medical History:  Diagnosis Date  . Hypertension    Past Surgical History:  Procedure Laterality Date  . ABDOMINAL HYSTERECTOMY    . VAGINAL DELIVERY  2   Family History  Problem Relation Age of Onset  . Cancer Mother     unsure type  . Dementia Mother   . Aneurysm Sister   . Cancer Sister   . Cancer Brother     STOMACH  . Diabetes Brother   . Diabetes Sister   . Heart disease Sister    Social History   Social History  . Marital status: Married    Spouse name: N/A  . Number of children: N/A  . Years of education: N/A   Social History Main Topics  . Smoking status: Former Smoker    Types: Cigarettes    Quit date: 11/03/1978  . Smokeless tobacco: Never Used     Comment: 1980  . Alcohol use Yes     Comment: social  . Drug use: No  . Sexual activity: Not Currently    Partners: Male    Birth control/ protection: Surgical     Comment: hysterectomy.   Other Topics Concern  . None   Social History Narrative   Lives in [place].      School - BA in [employer]      Work - Oceanographer    Outpatient Encounter Prescriptions as of 03/27/2016  Medication Sig  . Calcium Carbonate-Vitamin D (CALCIUM-VITAMIN D) 500-200 MG-UNIT per tablet Take 2 tablets by mouth 3 (three) times daily with meals.   . hydrochlorothiazide (HYDRODIURIL) 25 MG tablet Take 1 tablet (25 mg total) by mouth daily.  Marland Kitchen losartan (COZAAR) 25 MG tablet Take 1 tablet (25 mg total) by mouth daily.  . [DISCONTINUED] Olopatadine HCl 0.2 % SOLN Apply 1 drop to eye daily as needed.  . [DISCONTINUED] pantoprazole (PROTONIX) 40 MG tablet Take 1 tablet (40 mg total) by mouth 2 (two) times daily.   No facility-administered encounter medications on file as of 03/27/2016.     Review of Systems  Constitutional: Negative for appetite change and unexpected weight change.  HENT: Negative for congestion and sinus pressure.   Respiratory: Negative for cough, chest tightness and shortness of breath.   Cardiovascular: Negative for chest pain, palpitations and leg swelling.  Gastrointestinal: Negative for abdominal pain, diarrhea, nausea and vomiting.  Genitourinary: Negative for difficulty urinating and dysuria.  Musculoskeletal: Negative for back pain and joint swelling.  Skin: Negative for color change and rash.  Neurological: Negative for dizziness and headaches.  Psychiatric/Behavioral: Negative for agitation and dysphoric mood.       Objective:    Physical  Exam  Constitutional: She appears well-developed and well-nourished. No distress.  HENT:  Nose: Nose normal.  Mouth/Throat: Oropharynx is clear and moist.  Neck: Neck supple. No thyromegaly present.  Cardiovascular: Normal rate and regular rhythm.   Pulmonary/Chest: Breath sounds normal. No respiratory distress. She has no wheezes.  Abdominal: Soft. Bowel sounds are normal. There is no tenderness.  Musculoskeletal: She exhibits no edema or tenderness.  Lymphadenopathy:    She has no cervical adenopathy.  Skin: No rash noted. No erythema.  Psychiatric: She has a normal mood and affect. Her behavior is normal.    BP 140/88   Pulse 95   Temp 98 F (36.7 C) (Oral)   Ht 5\' 7"  (1.702 m)   Wt 224 lb 3.2 oz (101.7 kg)   SpO2 96%   BMI 35.11 kg/m  Wt Readings  from Last 3 Encounters:  03/27/16 224 lb 3.2 oz (101.7 kg)  09/19/15 221 lb 6.4 oz (100.4 kg)  05/01/15 223 lb 2 oz (101.2 kg)     Lab Results  Component Value Date   WBC 8.8 09/23/2015   HGB 11.3 (L) 09/23/2015   HCT 34.8 (L) 09/23/2015   PLT 372.0 09/23/2015   GLUCOSE 103 (H) 09/23/2015   CHOL 185 05/01/2015   TRIG 160.0 (H) 05/01/2015   HDL 56.60 05/01/2015   LDLCALC 96 05/01/2015   ALT 13 09/23/2015   AST 16 09/23/2015   NA 140 09/23/2015   K 3.9 09/23/2015   CL 105 09/23/2015   CREATININE 0.76 09/23/2015   BUN 10 09/23/2015   CO2 27 09/23/2015   TSH 1.00 04/17/2014   HGBA1C 5.7 04/17/2014   MICROALBUR 0.7 05/01/2015       Assessment & Plan:   Problem List Items Addressed This Visit    Anemia    Documented history of anemia.  Recheck cbc.        Relevant Orders   CBC with Differential/Platelet   Vitamin B12   Ferritin   Hypertension    Blood pressure elevated today.  She will spot check her pressure.  Get her back in soon to reassess.  Follow pressures.  Follow metabolic panel.        Relevant Orders   Lipid panel   Comprehensive metabolic panel   TSH   Obesity (BMI 30-39.9)    Diet and exercise.         Other Visit Diagnoses    Encounter for Medicare annual wellness exam    -  Primary     I spent 25 minutes with the patient and more than 50% of the time was spent in consultation regarding the above.  Time spent obtaining information about her past history and current issues.  Discussed treatment options and plan for f/u.       Einar Pheasant, MD

## 2016-03-28 ENCOUNTER — Encounter: Payer: Self-pay | Admitting: Internal Medicine

## 2016-03-28 NOTE — Assessment & Plan Note (Addendum)
Documented history of anemia.  Recheck cbc.

## 2016-03-28 NOTE — Assessment & Plan Note (Signed)
Blood pressure elevated today.  She will spot check her pressure.  Get her back in soon to reassess.  Follow pressures.  Follow metabolic panel.

## 2016-03-28 NOTE — Assessment & Plan Note (Signed)
Diet and exercise.   

## 2016-04-08 ENCOUNTER — Other Ambulatory Visit: Payer: Self-pay | Admitting: *Deleted

## 2016-04-08 MED ORDER — LOSARTAN POTASSIUM 25 MG PO TABS: 25.0000 mg | ORAL_TABLET | Freq: Every day | ORAL | 3 refills | Status: DC

## 2016-04-14 ENCOUNTER — Other Ambulatory Visit: Payer: Commercial Managed Care - HMO

## 2016-04-15 ENCOUNTER — Other Ambulatory Visit (INDEPENDENT_AMBULATORY_CARE_PROVIDER_SITE_OTHER): Payer: Commercial Managed Care - HMO

## 2016-04-15 DIAGNOSIS — D649 Anemia, unspecified: Secondary | ICD-10-CM | POA: Diagnosis not present

## 2016-04-15 DIAGNOSIS — I10 Essential (primary) hypertension: Secondary | ICD-10-CM

## 2016-04-15 LAB — LIPID PANEL
CHOL/HDL RATIO: 3
CHOLESTEROL: 183 mg/dL (ref 0–200)
HDL: 59.1 mg/dL (ref >39.00)
LDL Cholesterol: 100 mg/dL — ABNORMAL HIGH (ref 0–99)
NonHDL: 123.89
TRIGLYCERIDES: 118 mg/dL (ref 0.0–149.0)
VLDL: 23.6 mg/dL (ref 0.0–40.0)

## 2016-04-15 LAB — CBC WITH DIFFERENTIAL/PLATELET
Basophils Absolute: 0 10*3/uL (ref 0.0–0.1)
Basophils Relative: 0.5 % (ref 0.0–3.0)
EOS ABS: 0.1 10*3/uL (ref 0.0–0.7)
Eosinophils Relative: 1.3 % (ref 0.0–5.0)
HEMATOCRIT: 35.1 % — AB (ref 36.0–46.0)
HEMOGLOBIN: 11.4 g/dL — AB (ref 12.0–15.0)
LYMPHS PCT: 29.6 % (ref 12.0–46.0)
Lymphs Abs: 2 10*3/uL (ref 0.7–4.0)
MCHC: 32.5 g/dL (ref 30.0–36.0)
MCV: 87.1 fl (ref 78.0–100.0)
MONOS PCT: 7.1 % (ref 3.0–12.0)
Monocytes Absolute: 0.5 10*3/uL (ref 0.1–1.0)
Neutro Abs: 4.1 10*3/uL (ref 1.4–7.7)
Neutrophils Relative %: 61.5 % (ref 43.0–77.0)
Platelets: 400 10*3/uL (ref 150.0–400.0)
RBC: 4.03 Mil/uL (ref 3.87–5.11)
RDW: 14.2 % (ref 11.5–15.5)
WBC: 6.6 10*3/uL (ref 4.0–10.5)

## 2016-04-15 LAB — COMPREHENSIVE METABOLIC PANEL
ALBUMIN: 4.2 g/dL (ref 3.5–5.2)
ALK PHOS: 60 U/L (ref 39–117)
ALT: 13 U/L (ref 0–35)
AST: 13 U/L (ref 0–37)
BILIRUBIN TOTAL: 0.5 mg/dL (ref 0.2–1.2)
BUN: 9 mg/dL (ref 6–23)
CALCIUM: 10 mg/dL (ref 8.4–10.5)
CO2: 30 mEq/L (ref 19–32)
Chloride: 107 mEq/L (ref 96–112)
Creatinine, Ser: 0.81 mg/dL (ref 0.40–1.20)
GFR: 90.01 mL/min (ref >60.00)
Glucose, Bld: 97 mg/dL (ref 70–99)
Potassium: 4.4 mEq/L (ref 3.5–5.1)
Sodium: 143 mEq/L (ref 135–145)
TOTAL PROTEIN: 6.8 g/dL (ref 6.0–8.3)

## 2016-04-15 LAB — TSH: TSH: 1.36 u[IU]/mL (ref 0.35–4.50)

## 2016-04-15 LAB — FERRITIN: FERRITIN: 259.4 ng/mL (ref 10.0–291.0)

## 2016-04-15 LAB — VITAMIN B12: VITAMIN B 12: 1342 pg/mL — AB (ref 211–911)

## 2016-06-05 ENCOUNTER — Ambulatory Visit: Payer: Commercial Managed Care - HMO | Admitting: Internal Medicine

## 2016-06-08 ENCOUNTER — Ambulatory Visit: Payer: Commercial Managed Care - HMO | Admitting: Internal Medicine

## 2016-06-11 ENCOUNTER — Ambulatory Visit (INDEPENDENT_AMBULATORY_CARE_PROVIDER_SITE_OTHER): Payer: Medicare HMO | Admitting: Internal Medicine

## 2016-06-11 ENCOUNTER — Encounter: Payer: Self-pay | Admitting: Internal Medicine

## 2016-06-11 DIAGNOSIS — I10 Essential (primary) hypertension: Secondary | ICD-10-CM | POA: Diagnosis not present

## 2016-06-11 DIAGNOSIS — D649 Anemia, unspecified: Secondary | ICD-10-CM | POA: Diagnosis not present

## 2016-06-11 DIAGNOSIS — E669 Obesity, unspecified: Secondary | ICD-10-CM | POA: Diagnosis not present

## 2016-06-11 MED ORDER — LOSARTAN POTASSIUM 50 MG PO TABS
50.0000 mg | ORAL_TABLET | Freq: Every day | ORAL | 0 refills | Status: DC
Start: 2016-06-11 — End: 2016-08-17

## 2016-06-11 NOTE — Patient Instructions (Signed)
Change losartan to 50mg  per day.

## 2016-06-11 NOTE — Progress Notes (Signed)
Patient ID: Kylie Diaz, female   DOB: 1945/05/17, 71 y.o.   MRN: [id number]   Subjective:    Patient ID: Kylie Diaz, female    DOB: [birth year], 71 y.o.   MRN: [id number]  HPI  Patient here for a scheduled follow up.  States she is doing relatively well.  Blood pressure still elevated.  Discussed with her today.  She is not taking hctz.  Has not taken since her last visit here.  She is taking losartan 25mg  q day on a regular basis.  Tolerates.  No chest pain.  No sob.  No acid reflux reported.  No nausea or vomiting.  Bowels moving.  Handling stress.    Past Medical History:  Diagnosis Date  . Hypertension    Past Surgical History:  Procedure Laterality Date  . ABDOMINAL HYSTERECTOMY    . VAGINAL DELIVERY  2   Family History  Problem Relation Age of Onset  . Cancer Mother     unsure type  . Dementia Mother   . Aneurysm Sister   . Cancer Sister   . Cancer Brother     STOMACH  . Diabetes Brother   . Diabetes Sister   . Heart disease Sister    Social History   Social History  . Marital status: Married    Spouse name: N/A  . Number of children: N/A  . Years of education: N/A   Social History Main Topics  . Smoking status: Former Smoker    Types: Cigarettes    Quit date: 11/03/1978  . Smokeless tobacco: Never Used     Comment: 1980  . Alcohol use Yes     Comment: social  . Drug use: No  . Sexual activity: Not Currently    Partners: Male    Birth control/ protection: Surgical     Comment: hysterectomy.   Other Topics Concern  . None   Social History Narrative   Lives in [place].      School - BA in [employer]      Work - Oceanographer    Outpatient Encounter Prescriptions as of 06/11/2016  Medication Sig  . Calcium Carbonate-Vitamin D (CALCIUM-VITAMIN D) 500-200 MG-UNIT per tablet Take 2 tablets by mouth 3 (three) times daily with meals.  . hydrochlorothiazide (HYDRODIURIL) 25 MG tablet Take 1 tablet (25 mg total) by mouth daily.  .  [DISCONTINUED] losartan (COZAAR) 25 MG tablet Take 1 tablet (25 mg total) by mouth daily.  Marland Kitchen losartan (COZAAR) 50 MG tablet Take 1 tablet (50 mg total) by mouth daily.   No facility-administered encounter medications on file as of 06/11/2016.     Review of Systems  Constitutional: Negative for appetite change and unexpected weight change.  HENT: Negative for congestion and sinus pressure.   Respiratory: Negative for cough, chest tightness and shortness of breath.   Cardiovascular: Negative for chest pain, palpitations and leg swelling.  Gastrointestinal: Negative for abdominal pain, diarrhea, nausea and vomiting.  Genitourinary: Negative for difficulty urinating and dysuria.  Musculoskeletal: Negative for back pain and joint swelling.  Skin: Negative for color change and rash.  Neurological: Negative for dizziness, light-headedness and headaches.  Psychiatric/Behavioral: Negative for agitation and dysphoric mood.       Objective:     Blood pressure rechecked by me:  138/82  Physical Exam  Constitutional: She appears well-developed and well-nourished. No distress.  HENT:  Nose: Nose normal.  Mouth/Throat: Oropharynx is clear and moist.  Neck: Neck supple. No  thyromegaly present.  Cardiovascular: Normal rate and regular rhythm.   Pulmonary/Chest: Breath sounds normal. No respiratory distress. She has no wheezes.  Abdominal: Soft. Bowel sounds are normal. There is no tenderness.  Musculoskeletal: She exhibits no edema or tenderness.  Lymphadenopathy:    She has no cervical adenopathy.  Skin: No rash noted. No erythema.  Psychiatric: She has a normal mood and affect. Her behavior is normal.    BP 140/78 (BP Location: Left Arm, Patient Position: Sitting, Cuff Size: Large)   Pulse 84   Temp 98.4 F (36.9 C) (Oral)   Resp 16   Ht 5\' 7"  (1.702 m)   Wt 223 lb 12.8 oz (101.5 kg)   SpO2 98%   BMI 35.05 kg/m  Wt Readings from Last 3 Encounters:  06/11/16 223 lb 12.8 oz (101.5  kg)  03/27/16 224 lb 3.2 oz (101.7 kg)  09/19/15 221 lb 6.4 oz (100.4 kg)     Lab Results  Component Value Date   WBC 6.6 04/15/2016   HGB 11.4 (L) 04/15/2016   HCT 35.1 (L) 04/15/2016   PLT 400.0 04/15/2016   GLUCOSE 97 04/15/2016   CHOL 183 04/15/2016   TRIG 118.0 04/15/2016   HDL 59.10 04/15/2016   LDLCALC 100 (H) 04/15/2016   ALT 13 04/15/2016   AST 13 04/15/2016   NA 143 04/15/2016   K 4.4 04/15/2016   CL 107 04/15/2016   CREATININE 0.81 04/15/2016   BUN 9 04/15/2016   CO2 30 04/15/2016   TSH 1.36 04/15/2016   HGBA1C 5.7 04/17/2014   MICROALBUR 0.7 05/01/2015       Assessment & Plan:   Problem List Items Addressed This Visit    Anemia    hgb stable on last check 11.4.  Follow.        Hypertension    Blood pressure remains a little elevated.  Increase losartan to 50mg  q day.  She has not been taking hctz.  Monitor blood pressure.  Get her back in to reassess.  She will spot check her pressure and send in.  Follow metabolic panel.        Relevant Medications   losartan (COZAAR) 50 MG tablet   Obesity (BMI 30-39.9)    Diet and exercise.  Follow.           Einar Pheasant, MD '

## 2016-06-11 NOTE — Progress Notes (Signed)
Pre-visit discussion using our clinic review tool. No additional management support is needed unless otherwise documented below in the visit note.  

## 2016-06-14 ENCOUNTER — Encounter: Payer: Self-pay | Admitting: Internal Medicine

## 2016-06-14 NOTE — Assessment & Plan Note (Signed)
Diet and exercise.  Follow.  

## 2016-06-14 NOTE — Assessment & Plan Note (Signed)
hgb stable on last check 11.4.  Follow.

## 2016-06-14 NOTE — Assessment & Plan Note (Signed)
Blood pressure remains a little elevated.  Increase losartan to 50mg  q day.  She has not been taking hctz.  Monitor blood pressure.  Get her back in to reassess.  She will spot check her pressure and send in.  Follow metabolic panel.

## 2016-07-03 ENCOUNTER — Other Ambulatory Visit: Payer: Self-pay | Admitting: Internal Medicine

## 2016-07-03 DIAGNOSIS — Z1239 Encounter for other screening for malignant neoplasm of breast: Secondary | ICD-10-CM

## 2016-07-03 NOTE — Progress Notes (Signed)
Order placed for mammogram.

## 2016-07-28 ENCOUNTER — Encounter: Payer: Self-pay | Admitting: Internal Medicine

## 2016-08-14 ENCOUNTER — Ambulatory Visit
Admission: RE | Admit: 2016-08-14 | Discharge: 2016-08-14 | Disposition: A | Discharge status: Home / Self Care | Payer: Medicare HMO | Source: Ambulatory Visit | Attending: Internal Medicine | Admitting: Internal Medicine

## 2016-08-14 DIAGNOSIS — Z1239 Encounter for other screening for malignant neoplasm of breast: Secondary | ICD-10-CM

## 2016-08-14 DIAGNOSIS — Z1231 Encounter for screening mammogram for malignant neoplasm of breast: Secondary | ICD-10-CM | POA: Diagnosis not present

## 2016-08-17 ENCOUNTER — Telehealth: Payer: Self-pay | Admitting: Internal Medicine

## 2016-08-17 ENCOUNTER — Other Ambulatory Visit: Payer: Self-pay | Admitting: Internal Medicine

## 2016-08-17 NOTE — Telephone Encounter (Signed)
Printed and put with notes for app tomorrow.

## 2016-08-17 NOTE — Telephone Encounter (Signed)
Pt called asking to speak with you, You were unavailable she asked if you or Dr. Nicki Reaper had a voicemail, advised pt that you do not. Tried asking the pt if I could take a message but she hung up. Pt has an appt for tomorrow at 3. Please advise, thank you!

## 2016-08-17 NOTE — Telephone Encounter (Signed)
If she is doing ok, will address at her appt tomorrow.  That way I can check her pressure and determine best medication regimen.

## 2016-08-17 NOTE — Telephone Encounter (Signed)
Called patient states that she was called by insurance to get refill on the Losartan 50mg  but she did not like the higher dose. She states that we soul be getting refill request and she would like the 25mg . I have informed I will let you know as well as put a note with her PPW for app tomorrow to make sure it is addressed.

## 2016-08-18 ENCOUNTER — Ambulatory Visit (INDEPENDENT_AMBULATORY_CARE_PROVIDER_SITE_OTHER): Payer: Medicare HMO | Admitting: Internal Medicine

## 2016-08-18 ENCOUNTER — Encounter: Payer: Self-pay | Admitting: Internal Medicine

## 2016-08-18 DIAGNOSIS — I10 Essential (primary) hypertension: Secondary | ICD-10-CM | POA: Diagnosis not present

## 2016-08-18 DIAGNOSIS — D649 Anemia, unspecified: Secondary | ICD-10-CM

## 2016-08-18 DIAGNOSIS — E669 Obesity, unspecified: Secondary | ICD-10-CM

## 2016-08-18 MED ORDER — LOSARTAN POTASSIUM 25 MG PO TABS
25.0000 mg | ORAL_TABLET | Freq: Every day | ORAL | 1 refills | Status: DC
Start: 2016-08-18 — End: 2017-01-04

## 2016-08-18 NOTE — Progress Notes (Signed)
Patient ID: Kylie Diaz, female   DOB: [birth year], 71 y.o.   MRN: [id number]   Subjective:    Patient ID: Kylie Diaz, female    DOB: 1945-06-24, 71 y.o.   MRN: [id number]  HPI  Patient here for a scheduled follow up.  States she is doing relatively well.  Tries to stay active.  Last visit, I increased her losartan to 50mg  q day.  States she did not tolerate the increase.  Reports had been cleaning/mopping.  After finished, noticed increased heart rate.  Sat down.  Resolved.  This occurred after she started the 50mg  of losartan.  Stopped taking the 50mg  and went back to 25mg .  States no further issues.  Discussed further w/up and evaluation (i.e., ekg, further w/up) and she declined.  States knows was the medication.  No chest pain.  No sob.  No acid reflux.  No abdominal pain.  Bowels stable.     Past Medical History:  Diagnosis Date  . Hypertension    Past Surgical History:  Procedure Laterality Date  . ABDOMINAL HYSTERECTOMY    . VAGINAL DELIVERY  2   Family History  Problem Relation Age of Onset  . Cancer Mother        unsure type  . Dementia Mother   . Aneurysm Sister   . Cancer Sister   . Cancer Brother        STOMACH  . Diabetes Brother   . Diabetes Sister   . Heart disease Sister    Social History   Social History  . Marital status: Married    Spouse name: N/A  . Number of children: N/A  . Years of education: N/A   Social History Main Topics  . Smoking status: Former Smoker    Types: Cigarettes    Quit date: 11/03/1978  . Smokeless tobacco: Never Used     Comment: 1980  . Alcohol use Yes     Comment: social  . Drug use: No  . Sexual activity: Not Currently    Partners: Male    Birth control/ protection: Surgical     Comment: hysterectomy.   Other Topics Concern  . None   Social History Narrative   Lives in [place].      School - BA in [employer]      Work - Oceanographer    Outpatient Encounter Prescriptions as of 08/18/2016    Medication Sig  . Calcium Carbonate-Vitamin D (CALCIUM-VITAMIN D) 500-200 MG-UNIT per tablet Take 2 tablets by mouth 3 (three) times daily with meals.  . hydrochlorothiazide (HYDRODIURIL) 25 MG tablet Take 1 tablet (25 mg total) by mouth daily.  . [DISCONTINUED] losartan (COZAAR) 50 MG tablet TAKE 1 TABLET EVERY DAY (Patient taking differently: 25 mg daily)  . losartan (COZAAR) 25 MG tablet Take 1 tablet (25 mg total) by mouth daily.   No facility-administered encounter medications on file as of 08/18/2016.     Review of Systems  Constitutional: Negative for appetite change and unexpected weight change.  HENT: Negative for congestion and sinus pressure.   Respiratory: Negative for cough, chest tightness and shortness of breath.   Cardiovascular: Negative for chest pain, palpitations and leg swelling.  Gastrointestinal: Negative for abdominal pain, diarrhea, nausea and vomiting.  Genitourinary: Negative for difficulty urinating and dysuria.  Musculoskeletal: Negative for back pain and joint swelling.  Skin: Negative for color change and rash.  Neurological: Negative for dizziness, light-headedness and headaches.  Psychiatric/Behavioral: Negative for agitation  and dysphoric mood.       Objective:    Physical Exam  Constitutional: She appears well-developed and well-nourished. No distress.  HENT:  Nose: Nose normal.  Mouth/Throat: Oropharynx is clear and moist.  Neck: Neck supple. No thyromegaly present.  Cardiovascular: Normal rate and regular rhythm.   Pulmonary/Chest: Breath sounds normal. No respiratory distress. She has no wheezes.  Abdominal: Soft. Bowel sounds are normal. There is no tenderness.  Musculoskeletal: She exhibits no edema or tenderness.  Lymphadenopathy:    She has no cervical adenopathy.  Skin: No rash noted. No erythema.  Psychiatric: She has a normal mood and affect.    BP (!) 150/88 (BP Location: Left Arm, Patient Position: Sitting, Cuff Size: Large)    Pulse 82   Temp 98.6 F (37 C) (Oral)   Ht 5\' 7"  (1.702 m)   Wt 221 lb 1.9 oz (100.3 kg)   SpO2 97%   BMI 34.63 kg/m  Wt Readings from Last 3 Encounters:  08/18/16 221 lb 1.9 oz (100.3 kg)  06/11/16 223 lb 12.8 oz (101.5 kg)  03/27/16 224 lb 3.2 oz (101.7 kg)     Lab Results  Component Value Date   WBC 6.6 04/15/2016   HGB 11.4 (L) 04/15/2016   HCT 35.1 (L) 04/15/2016   PLT 400.0 04/15/2016   GLUCOSE 97 04/15/2016   CHOL 183 04/15/2016   TRIG 118.0 04/15/2016   HDL 59.10 04/15/2016   LDLCALC 100 (H) 04/15/2016   ALT 13 04/15/2016   AST 13 04/15/2016   NA 143 04/15/2016   K 4.4 04/15/2016   CL 107 04/15/2016   CREATININE 0.81 04/15/2016   BUN 9 04/15/2016   CO2 30 04/15/2016   TSH 1.36 04/15/2016   HGBA1C 5.7 04/17/2014   MICROALBUR 0.7 05/01/2015    Mm Digital Screening Bilateral  Result Date: 08/14/2016 CLINICAL DATA:  Screening. EXAM: DIGITAL SCREENING BILATERAL MAMMOGRAM WITH CAD COMPARISON:  Previous exam(s). ACR Breast Density Category b: There are scattered areas of fibroglandular density. FINDINGS: There are no findings suspicious for malignancy. Images were processed with CAD. IMPRESSION: No mammographic evidence of malignancy. A result letter of this screening mammogram will be mailed directly to the patient. RECOMMENDATION: Screening mammogram in one year. (Code:SM-B-01Y) BI-RADS CATEGORY  1: Negative. Electronically Signed   By: Dorise Bullion III M.D   On: 08/14/2016 11:01       Assessment & Plan:   Problem List Items Addressed This Visit    Anemia    Follow cbc.        Relevant Orders   CBC with Differential/Platelet   Ferritin   Hypertension    Blood pressure on recheck remains a little elevated. (124P systolic).  Will add back hctz 25mg  q day.  Continue losartan 25mg  q day.  She did not tolerate 50mg  dose.  Follow pressures.  Follow metabolic panel.        Relevant Medications   losartan (COZAAR) 25 MG tablet   Other Relevant Orders    Hepatic function panel   Lipid panel   Basic metabolic panel   Obesity (BMI 30-39.9)    Diet and exercise.  Follow.            Einar Pheasant, MD

## 2016-08-18 NOTE — Progress Notes (Signed)
Pre-visit discussion using our clinic review tool. No additional management support is needed unless otherwise documented below in the visit note.  

## 2016-08-20 ENCOUNTER — Encounter: Payer: Self-pay | Admitting: Internal Medicine

## 2016-08-20 NOTE — Assessment & Plan Note (Signed)
Follow cbc.  

## 2016-08-20 NOTE — Assessment & Plan Note (Signed)
Diet and exercise.  Follow.  

## 2016-08-20 NOTE — Assessment & Plan Note (Signed)
Blood pressure on recheck remains a little elevated. (859C systolic).  Will add back hctz 25mg  q day.  Continue losartan 25mg  q day.  She did not tolerate 50mg  dose.  Follow pressures.  Follow metabolic panel.

## 2016-09-15 ENCOUNTER — Other Ambulatory Visit: Payer: Medicare HMO

## 2016-09-15 ENCOUNTER — Other Ambulatory Visit (INDEPENDENT_AMBULATORY_CARE_PROVIDER_SITE_OTHER): Payer: Medicare HMO

## 2016-09-15 DIAGNOSIS — I10 Essential (primary) hypertension: Secondary | ICD-10-CM

## 2016-09-15 DIAGNOSIS — D649 Anemia, unspecified: Secondary | ICD-10-CM

## 2016-09-15 LAB — HEPATIC FUNCTION PANEL
ALT: 9 U/L (ref 0–35)
AST: 13 U/L (ref 0–37)
Albumin: 4.4 g/dL (ref 3.5–5.2)
Alkaline Phosphatase: 58 U/L (ref 39–117)
BILIRUBIN DIRECT: 0.1 mg/dL (ref 0.0–0.3)
BILIRUBIN TOTAL: 0.6 mg/dL (ref 0.2–1.2)
Total Protein: 7.5 g/dL (ref 6.0–8.3)

## 2016-09-15 LAB — BASIC METABOLIC PANEL
BUN: 10 mg/dL (ref 6–23)
CALCIUM: 10.3 mg/dL (ref 8.4–10.5)
CO2: 31 mEq/L (ref 19–32)
CREATININE: 0.83 mg/dL (ref 0.40–1.20)
Chloride: 104 mEq/L (ref 96–112)
GFR: 87.41 mL/min (ref >60.00)
Glucose, Bld: 105 mg/dL — ABNORMAL HIGH (ref 70–99)
Potassium: 4.2 mEq/L (ref 3.5–5.1)
Sodium: 140 mEq/L (ref 135–145)

## 2016-09-15 LAB — LIPID PANEL
CHOL/HDL RATIO: 3
CHOLESTEROL: 177 mg/dL (ref 0–200)
HDL: 64.6 mg/dL (ref >39.00)
LDL Cholesterol: 96 mg/dL (ref 0–99)
NonHDL: 112.47
TRIGLYCERIDES: 80 mg/dL (ref 0.0–149.0)
VLDL: 16 mg/dL (ref 0.0–40.0)

## 2016-09-15 LAB — CBC WITH DIFFERENTIAL/PLATELET
BASOS ABS: 0 10*3/uL (ref 0.0–0.1)
Basophils Relative: 0.5 % (ref 0.0–3.0)
EOS ABS: 0.1 10*3/uL (ref 0.0–0.7)
Eosinophils Relative: 1.3 % (ref 0.0–5.0)
HCT: 37 % (ref 36.0–46.0)
HEMOGLOBIN: 12.1 g/dL (ref 12.0–15.0)
LYMPHS PCT: 23.7 % (ref 12.0–46.0)
Lymphs Abs: 1.6 10*3/uL (ref 0.7–4.0)
MCHC: 32.6 g/dL (ref 30.0–36.0)
MCV: 86 fl (ref 78.0–100.0)
MONO ABS: 0.6 10*3/uL (ref 0.1–1.0)
Monocytes Relative: 8.3 % (ref 3.0–12.0)
NEUTROS ABS: 4.6 10*3/uL (ref 1.4–7.7)
Neutrophils Relative %: 66.2 % (ref 43.0–77.0)
PLATELETS: 410 10*3/uL — AB (ref 150.0–400.0)
RBC: 4.3 Mil/uL (ref 3.87–5.11)
RDW: 14.2 % (ref 11.5–15.5)
WBC: 7 10*3/uL (ref 4.0–10.5)

## 2016-09-15 LAB — FERRITIN: FERRITIN: 238 ng/mL (ref 10.0–291.0)

## 2016-09-18 ENCOUNTER — Telehealth: Payer: Self-pay | Admitting: Internal Medicine

## 2016-09-18 NOTE — Telephone Encounter (Signed)
Pt called back returning your call. Thank you! °

## 2016-09-18 NOTE — Telephone Encounter (Signed)
I think that per last lab note you had to tried to call and go over with her the labs.

## 2016-09-20 NOTE — Telephone Encounter (Signed)
Left patient a message.  Unable to talk with her in person.

## 2016-09-29 ENCOUNTER — Telehealth: Payer: Self-pay | Admitting: Internal Medicine

## 2016-09-29 NOTE — Telephone Encounter (Signed)
Pt called requesting to speak with someone in regards to her blood pressure medication and that recall. Please advise, thank you!  Call pt @ [phone]

## 2016-09-29 NOTE — Telephone Encounter (Signed)
Left message to return call to our office.  

## 2016-09-30 NOTE — Telephone Encounter (Signed)
Left message to return call to our office.  

## 2016-09-30 NOTE — Telephone Encounter (Signed)
Please call pt at  [phone]

## 2016-09-30 NOTE — Telephone Encounter (Signed)
Spoke to patient she wanted to know if any of the blood pressure meds have the active ingredient that has been recalled

## 2016-10-01 NOTE — Telephone Encounter (Signed)
I am not aware of a recall with losartan.  Please contact her pharmacy and see if they are aware of any recall with losartan.  See me after calling pharmacy.

## 2016-10-01 NOTE — Telephone Encounter (Signed)
The recall is on Valsartan (see print out placed on your board). The pharmacies are contacting patients to make them aware. Then patients are to contact providers office for an alternative medication.

## 2016-10-01 NOTE — Telephone Encounter (Signed)
Patient informed. 

## 2016-10-01 NOTE — Telephone Encounter (Signed)
Notify pt - no recall on losartan.  Ok to continue current medication.

## 2016-10-20 ENCOUNTER — Encounter: Payer: Self-pay | Admitting: Internal Medicine

## 2016-10-20 ENCOUNTER — Ambulatory Visit (INDEPENDENT_AMBULATORY_CARE_PROVIDER_SITE_OTHER): Payer: Medicare HMO | Admitting: Internal Medicine

## 2016-10-20 VITALS — BP 146/82 | HR 67 | Temp 98.0°F | Resp 12 | Ht 67.0 in | Wt 220.4 lb

## 2016-10-20 DIAGNOSIS — E669 Obesity, unspecified: Secondary | ICD-10-CM

## 2016-10-20 DIAGNOSIS — D473 Essential (hemorrhagic) thrombocythemia: Secondary | ICD-10-CM | POA: Diagnosis not present

## 2016-10-20 DIAGNOSIS — I10 Essential (primary) hypertension: Secondary | ICD-10-CM | POA: Diagnosis not present

## 2016-10-20 DIAGNOSIS — D75839 Thrombocytosis, unspecified: Secondary | ICD-10-CM

## 2016-10-20 DIAGNOSIS — Z0000 Encounter for general adult medical examination without abnormal findings: Secondary | ICD-10-CM

## 2016-10-20 DIAGNOSIS — D649 Anemia, unspecified: Secondary | ICD-10-CM | POA: Diagnosis not present

## 2016-10-20 DIAGNOSIS — Z0001 Encounter for general adult medical examination with abnormal findings: Secondary | ICD-10-CM | POA: Diagnosis not present

## 2016-10-20 LAB — VITAMIN B12: Vitamin B-12: 601 pg/mL (ref 211–911)

## 2016-10-20 NOTE — Progress Notes (Signed)
Pre-visit discussion using our clinic review tool. No additional management support is needed unless otherwise documented below in the visit note.  

## 2016-10-20 NOTE — Progress Notes (Signed)
Patient ID: Kylie Diaz, female   DOB: July 08, 1945, 71 y.o.   MRN: [id number]   Subjective:    Patient ID: Kylie Diaz, female    DOB: 1946/02/28, 71 y.o.   MRN: [id number]  HPI  Patient here for her physical exam.  She reports she is doing relatively well.  Trying to stay active.  No chest pain.  No sob.  No acid reflux.  No abdominal pain.  Bowels moving.  Some fatigue.  She relates that this started with stopping her B12 supplements.  Blood pressure remaining elevated.  Did not tolerate increasing the losartan.  Last visit was instructed to take hctz.  She did not take regularly.  States blood pressure has been averaging 782-956O systolic.  Had questions about elevated platelet count.  Is exercising.     Past Medical History:  Diagnosis Date  . Hypertension    Past Surgical History:  Procedure Laterality Date  . ABDOMINAL HYSTERECTOMY    . VAGINAL DELIVERY  2   Family History  Problem Relation Age of Onset  . Cancer Mother        unsure type  . Dementia Mother   . Aneurysm Sister   . Cancer Sister   . Cancer Brother        STOMACH  . Diabetes Brother   . Diabetes Sister   . Heart disease Sister    Social History   Social History  . Marital status: Married    Spouse name: N/A  . Number of children: N/A  . Years of education: N/A   Social History Main Topics  . Smoking status: Former Smoker    Types: Cigarettes    Quit date: 11/03/1978  . Smokeless tobacco: Never Used     Comment: 1980  . Alcohol use Yes     Comment: social  . Drug use: No  . Sexual activity: Not Currently    Partners: Male    Birth control/ protection: Surgical     Comment: hysterectomy.   Other Topics Concern  . None   Social History Narrative   Lives in [place].      School - BA in [employer]      Work - Oceanographer    Outpatient Encounter Prescriptions as of 10/20/2016  Medication Sig  . Calcium Carbonate-Vitamin D (CALCIUM-VITAMIN D) 500-200 MG-UNIT per tablet  Take 2 tablets by mouth 3 (three) times daily with meals.  . hydrochlorothiazide (HYDRODIURIL) 25 MG tablet Take 1 tablet (25 mg total) by mouth daily. (Patient taking differently: Take 25 mg by mouth daily. )  . losartan (COZAAR) 25 MG tablet Take 1 tablet (25 mg total) by mouth daily.  . Omega-3 Fatty Acids (FISH OIL) 1000 MG CAPS Take 1 capsule by mouth daily.  . Potassium (POTASSIMIN PO) Take by mouth as needed.  . vitamin E 100 UNIT capsule Take 100 Units by mouth daily.   No facility-administered encounter medications on file as of 10/20/2016.     Review of Systems  Constitutional: Positive for fatigue. Negative for appetite change and unexpected weight change.  HENT: Negative for congestion and sinus pressure.   Eyes: Negative for pain and visual disturbance.  Respiratory: Negative for cough, chest tightness and shortness of breath.   Cardiovascular: Negative for chest pain, palpitations and leg swelling.  Gastrointestinal: Negative for abdominal pain, diarrhea, nausea and vomiting.  Genitourinary: Negative for difficulty urinating and dysuria.  Musculoskeletal: Negative for back pain and joint swelling.  Skin:  Negative for color change and rash.  Neurological: Negative for dizziness, light-headedness and headaches.  Hematological: Negative for adenopathy. Does not bruise/bleed easily.  Psychiatric/Behavioral: Negative for agitation and dysphoric mood.       Objective:    Physical Exam  Constitutional: She is oriented to person, place, and time. She appears well-developed and well-nourished. No distress.  HENT:  Nose: Nose normal.  Mouth/Throat: Oropharynx is clear and moist.  Eyes: Right eye exhibits no discharge. Left eye exhibits no discharge. No scleral icterus.  Neck: Neck supple. No thyromegaly present.  Cardiovascular: Normal rate and regular rhythm.   Pulmonary/Chest: Breath sounds normal. No accessory muscle usage. No tachypnea. No respiratory distress. She has no  decreased breath sounds. She has no wheezes. She has no rhonchi. Right breast exhibits no inverted nipple, no mass, no nipple discharge and no tenderness (no axillary adenopathy). Left breast exhibits no inverted nipple, no mass, no nipple discharge and no tenderness (no axilarry adenopathy).  Abdominal: Soft. Bowel sounds are normal. There is no tenderness.  Musculoskeletal: She exhibits no edema or tenderness.  Lymphadenopathy:    She has no cervical adenopathy.  Neurological: She is alert and oriented to person, place, and time.  Skin: Skin is warm. No rash noted. No erythema.  Psychiatric: She has a normal mood and affect. Her behavior is normal.    BP (!) 146/82 (BP Location: Left Arm, Patient Position: Sitting, Cuff Size: Large)   Pulse 67   Temp 98 F (36.7 C) (Oral)   Resp 12   Ht 5\' 7"  (1.702 m)   Wt 220 lb 6.4 oz (100 kg)   SpO2 96%   BMI 34.52 kg/m  Wt Readings from Last 3 Encounters:  10/20/16 220 lb 6.4 oz (100 kg)  08/18/16 221 lb 1.9 oz (100.3 kg)  06/11/16 223 lb 12.8 oz (101.5 kg)     Lab Results  Component Value Date   WBC 7.0 09/15/2016   HGB 12.1 09/15/2016   HCT 37.0 09/15/2016   PLT 394 10/20/2016   GLUCOSE 105 (H) 09/15/2016   CHOL 177 09/15/2016   TRIG 80.0 09/15/2016   HDL 64.60 09/15/2016   LDLCALC 96 09/15/2016   ALT 9 09/15/2016   AST 13 09/15/2016   NA 140 09/15/2016   K 4.2 09/15/2016   CL 104 09/15/2016   CREATININE 0.83 09/15/2016   BUN 10 09/15/2016   CO2 31 09/15/2016   TSH 1.36 04/15/2016   HGBA1C 5.7 04/17/2014   MICROALBUR 0.7 05/01/2015    Mm Digital Screening Bilateral  Result Date: 08/14/2016 CLINICAL DATA:  Screening. EXAM: DIGITAL SCREENING BILATERAL MAMMOGRAM WITH CAD COMPARISON:  Previous exam(s). ACR Breast Density Category b: There are scattered areas of fibroglandular density. FINDINGS: There are no findings suspicious for malignancy. Images were processed with CAD. IMPRESSION: No mammographic evidence of malignancy. A  result letter of this screening mammogram will be mailed directly to the patient. RECOMMENDATION: Screening mammogram in one year. (Code:SM-B-01Y) BI-RADS CATEGORY  1: Negative. Electronically Signed   By: Dorise Bullion III M.D   On: 08/14/2016 11:01       Assessment & Plan:   Problem List Items Addressed This Visit    Anemia    Recent hgb wnl.        Healthcare maintenance    Physical today 10/20/16.  Last pap 2013.  Mammogram 08/14/16 - [facility].        Hypertension    Blood pressure elevated.  She did not tolerate increased  losartan.  Did not take hctz on a regular basis.  Will start.  Follow.        Obesity (BMI 30-39.9)    Discussed diet and exercise.  Follow.       Thrombocytosis (HCC) - Primary    Platelet count slightly increased on recent lab check.  Recheck platelet count today.       Relevant Orders   Vitamin B12 (Completed)   Platelet count (Completed)       Einar Pheasant, MD

## 2016-10-21 ENCOUNTER — Encounter: Payer: Self-pay | Admitting: Internal Medicine

## 2016-10-21 LAB — PLATELET COUNT: Platelets: 394 10*3/uL (ref 140–400)

## 2016-10-22 ENCOUNTER — Encounter: Payer: Self-pay | Admitting: Internal Medicine

## 2016-10-22 ENCOUNTER — Telehealth: Payer: Self-pay | Admitting: Internal Medicine

## 2016-10-22 DIAGNOSIS — D473 Essential (hemorrhagic) thrombocythemia: Secondary | ICD-10-CM | POA: Insufficient documentation

## 2016-10-22 DIAGNOSIS — D75839 Thrombocytosis, unspecified: Secondary | ICD-10-CM | POA: Insufficient documentation

## 2016-10-22 NOTE — Telephone Encounter (Signed)
Patient called gave information no questions.

## 2016-10-22 NOTE — Assessment & Plan Note (Signed)
Blood pressure elevated.  She did not tolerate increased losartan.  Did not take hctz on a regular basis.  Will start.  Follow.

## 2016-10-22 NOTE — Assessment & Plan Note (Signed)
Physical today 10/20/16.  Last pap 2013.  Mammogram 08/14/16 - [facility] I.

## 2016-10-22 NOTE — Assessment & Plan Note (Signed)
Recent hgb wnl.  

## 2016-10-22 NOTE — Assessment & Plan Note (Signed)
Discussed diet and exercise.  Follow.  

## 2016-10-22 NOTE — Telephone Encounter (Signed)
Pt called about her results. Pt states she cannot get into mychart to retreive them. Pt would like someone to call her with the lab results. Please and thank you!  Call pt @ [phone].

## 2016-10-22 NOTE — Telephone Encounter (Signed)
Called patient gave lab results documented in my chart message.

## 2016-10-22 NOTE — Assessment & Plan Note (Signed)
Platelet count slightly increased on recent lab check.  Recheck platelet count today.

## 2016-10-23 NOTE — Telephone Encounter (Signed)
Hard copy mailed  

## 2016-12-29 ENCOUNTER — Encounter: Payer: Self-pay | Admitting: Internal Medicine

## 2016-12-29 ENCOUNTER — Ambulatory Visit (INDEPENDENT_AMBULATORY_CARE_PROVIDER_SITE_OTHER): Payer: Medicare HMO | Admitting: Internal Medicine

## 2016-12-29 VITALS — BP 110/82 | HR 88 | Temp 98.2°F | Resp 20 | Ht 66.93 in | Wt 220.6 lb

## 2016-12-29 DIAGNOSIS — D75839 Thrombocytosis, unspecified: Secondary | ICD-10-CM

## 2016-12-29 DIAGNOSIS — Z6834 Body mass index (BMI) 34.0-34.9, adult: Secondary | ICD-10-CM

## 2016-12-29 DIAGNOSIS — I10 Essential (primary) hypertension: Secondary | ICD-10-CM | POA: Diagnosis not present

## 2016-12-29 DIAGNOSIS — D473 Essential (hemorrhagic) thrombocythemia: Secondary | ICD-10-CM

## 2016-12-29 NOTE — Progress Notes (Signed)
Patient ID: Kylie Diaz, female   DOB: Mar 30, 1945, 71 y.o.   MRN: [id number]   Subjective:    Patient ID: Kylie Diaz, female    DOB: Jun 26, 1945, 71 y.o.   MRN: [id number]  HPI  Patient here for a scheduled follow up.  She is now taking hctz and losartan.  Blood pressure doing better.  She feels better.  Doing better handling stress. Discussed with her today.  Discussed diet and exercise. No chest pain.  No sob.  No acid reflux.  No abodminal pain.  Bowels moving.     Past Medical History:  Diagnosis Date  . Hypertension    Past Surgical History:  Procedure Laterality Date  . ABDOMINAL HYSTERECTOMY    . VAGINAL DELIVERY  2   Family History  Problem Relation Age of Onset  . Cancer Mother        unsure type  . Dementia Mother   . Aneurysm Sister   . Cancer Sister   . Cancer Brother        STOMACH  . Diabetes Brother   . Diabetes Sister   . Heart disease Sister    Social History   Social History  . Marital status: Married    Spouse name: N/A  . Number of children: N/A  . Years of education: N/A   Social History Main Topics  . Smoking status: Former Smoker    Types: Cigarettes    Quit date: 11/03/1978  . Smokeless tobacco: Never Used     Comment: 1980  . Alcohol use Yes     Comment: social  . Drug use: No  . Sexual activity: Not Currently    Partners: Male    Birth control/ protection: Surgical     Comment: hysterectomy.   Other Topics Concern  . None   Social History Narrative   Lives in [place].      School - BA in [employer]      Work - Oceanographer    Outpatient Encounter Prescriptions as of 12/29/2016  Medication Sig  . Calcium Carbonate-Vitamin D (CALCIUM-VITAMIN D) 500-200 MG-UNIT per tablet Take 2 tablets by mouth 3 (three) times daily with meals.  . hydrochlorothiazide (HYDRODIURIL) 25 MG tablet Take 1 tablet (25 mg total) by mouth daily. (Patient taking differently: Take 25 mg by mouth daily. )  . losartan (COZAAR) 25 MG  tablet Take 1 tablet (25 mg total) by mouth daily.  . Omega-3 Fatty Acids (FISH OIL) 1000 MG CAPS Take 1 capsule by mouth daily.  . Potassium (POTASSIMIN PO) Take by mouth as needed.  . vitamin E 100 UNIT capsule Take 100 Units by mouth daily.   No facility-administered encounter medications on file as of 12/29/2016.     Review of Systems  Constitutional: Negative for appetite change and unexpected weight change.  HENT: Negative for congestion and sinus pressure.   Respiratory: Negative for cough, chest tightness and shortness of breath.   Cardiovascular: Negative for chest pain, palpitations and leg swelling.  Gastrointestinal: Negative for abdominal pain, diarrhea, nausea and vomiting.  Genitourinary: Negative for difficulty urinating and dysuria.  Musculoskeletal: Negative for back pain and joint swelling.  Skin: Negative for color change and rash.  Neurological: Negative for dizziness and light-headedness.  Psychiatric/Behavioral: Negative for agitation and dysphoric mood.       Objective:    Physical Exam  Constitutional: She appears well-developed and well-nourished. No distress.  HENT:  Nose: Nose normal.  Mouth/Throat: Oropharynx is  clear and moist.  Neck: Neck supple. No thyromegaly present.  Cardiovascular: Normal rate and regular rhythm.   Pulmonary/Chest: Breath sounds normal. No respiratory distress. She has no wheezes.  Abdominal: Soft. Bowel sounds are normal. There is no tenderness.  Musculoskeletal: She exhibits no edema or tenderness.  Lymphadenopathy:    She has no cervical adenopathy.  Skin: No rash noted. No erythema.  Psychiatric: She has a normal mood and affect. Her behavior is normal.    BP 110/82 (BP Location: Left Arm, Patient Position: Sitting, Cuff Size: Large)   Pulse 88   Temp 98.2 F (36.8 C) (Oral)   Resp 20   Ht 5' 6.93" (1.7 m)   Wt 220 lb 9.6 oz (100.1 kg)   SpO2 96%   BMI 34.62 kg/m  Wt Readings from Last 3 Encounters:  12/29/16  220 lb 9.6 oz (100.1 kg)  10/20/16 220 lb 6.4 oz (100 kg)  08/18/16 221 lb 1.9 oz (100.3 kg)     Lab Results  Component Value Date   WBC 7.0 09/15/2016   HGB 12.1 09/15/2016   HCT 37.0 09/15/2016   PLT 394 10/20/2016   GLUCOSE 81 12/29/2016   CHOL 177 09/15/2016   TRIG 80.0 09/15/2016   HDL 64.60 09/15/2016   LDLCALC 96 09/15/2016   ALT 9 09/15/2016   AST 13 09/15/2016   NA 141 12/29/2016   K 4.3 12/29/2016   CL 104 12/29/2016   CREATININE 0.85 12/29/2016   BUN 11 12/29/2016   CO2 27 12/29/2016   TSH 1.36 04/15/2016   HGBA1C 5.7 04/17/2014   MICROALBUR 0.7 05/01/2015    Mm Digital Screening Bilateral  Result Date: 08/14/2016 CLINICAL DATA:  Screening. EXAM: DIGITAL SCREENING BILATERAL MAMMOGRAM WITH CAD COMPARISON:  Previous exam(s). ACR Breast Density Category b: There are scattered areas of fibroglandular density. FINDINGS: There are no findings suspicious for malignancy. Images were processed with CAD. IMPRESSION: No mammographic evidence of malignancy. A result letter of this screening mammogram will be mailed directly to the patient. RECOMMENDATION: Screening mammogram in one year. (Code:SM-B-01Y) BI-RADS CATEGORY  1: Negative. Electronically Signed   By: Dorise Bullion III M.D   On: 08/14/2016 11:01       Assessment & Plan:   Problem List Items Addressed This Visit    BMI 34.0-34.9,adult    Discussed diet and exercise.        Hypertension - Primary    Blood pressure doing better on hctz and losartan.  Check metabolic panel.  Follow.       Relevant Orders   Basic metabolic panel (Completed)   Vitamin B12 (Completed)   Thrombocytosis (HCC)    Last check wnl.  Follow.            Einar Pheasant, MD

## 2016-12-30 LAB — BASIC METABOLIC PANEL
BUN: 11 mg/dL (ref 6–23)
CHLORIDE: 104 meq/L (ref 96–112)
CO2: 27 meq/L (ref 19–32)
Calcium: 10.1 mg/dL (ref 8.4–10.5)
Creatinine, Ser: 0.85 mg/dL (ref 0.40–1.20)
GFR: 84.97 mL/min (ref >60.00)
Glucose, Bld: 81 mg/dL (ref 70–99)
POTASSIUM: 4.3 meq/L (ref 3.5–5.1)
Sodium: 141 mEq/L (ref 135–145)

## 2016-12-30 LAB — VITAMIN B12: VITAMIN B 12: 464 pg/mL (ref 211–911)

## 2016-12-31 DIAGNOSIS — H524 Presbyopia: Secondary | ICD-10-CM | POA: Diagnosis not present

## 2017-01-01 ENCOUNTER — Encounter: Payer: Self-pay | Admitting: Internal Medicine

## 2017-01-01 NOTE — Assessment & Plan Note (Signed)
Blood pressure doing better on hctz and losartan.  Check metabolic panel.  Follow.

## 2017-01-01 NOTE — Assessment & Plan Note (Signed)
Last check wnl.  Follow.   

## 2017-01-01 NOTE — Assessment & Plan Note (Signed)
Discussed diet and exercise 

## 2017-01-04 ENCOUNTER — Other Ambulatory Visit: Payer: Self-pay | Admitting: Internal Medicine

## 2017-01-26 ENCOUNTER — Telehealth: Payer: Self-pay | Admitting: Internal Medicine

## 2017-01-26 NOTE — Telephone Encounter (Signed)
Please notify pt

## 2017-01-26 NOTE — Telephone Encounter (Signed)
Called pharmacy. There is a recall by one manufacturer. Pharmacist said this should not affect her getting her BP medicine.

## 2017-01-26 NOTE — Telephone Encounter (Signed)
Please clarify with pharmacy what the recall was for and which medication.

## 2017-01-26 NOTE — Telephone Encounter (Signed)
Pt informed that she can still get her BP medication. She still feels uncomfortable taking them unless you can guarantee there isn't any cancer causing agents in the medicines. Please advise.

## 2017-01-26 NOTE — Telephone Encounter (Signed)
Copied from Joice [id number]. Topic: Inquiry >> Jan 26, 2017  2:26 PM Conception Chancy, NT wrote: Reason for CRM: pt was Dr. Nicki Reaper to know that her BP medication is on a recall. The lasartin 25mg  and the hydrochlorothiazide 25mg 

## 2017-01-26 NOTE — Telephone Encounter (Signed)
Please advise 

## 2017-01-26 NOTE — Telephone Encounter (Signed)
Please call pharmacy and see if they are aware of recall.

## 2017-01-26 NOTE — Telephone Encounter (Signed)
Have you heard anything about these medications being recalled?

## 2017-01-27 NOTE — Telephone Encounter (Signed)
Called patient let her know. She did not have any further questions.

## 2017-01-27 NOTE — Telephone Encounter (Signed)
Called and spoke with dustin at [facility] order where she gets script from. Recall is for the Losartin/HCTZ Patient is not in this combo. And per insurance they do not have any of the recalled medication

## 2017-01-27 NOTE — Telephone Encounter (Signed)
Ok  Please notify pt

## 2017-02-15 ENCOUNTER — Telehealth: Payer: Self-pay | Admitting: Internal Medicine

## 2017-02-15 NOTE — Telephone Encounter (Signed)
Please advise 

## 2017-02-15 NOTE — Telephone Encounter (Signed)
Pt called in requesting to be taken off of HCTZ because it has been recalled.   "This is the 2nd time this medicine has been recalled"    She wants something else called in.

## 2017-02-15 NOTE — Telephone Encounter (Signed)
Patient says she loves the way the HCTZ with losartan is controlling her BP has been really good so she will call pharmacy before switching to see if her HCTZ is on recall.

## 2017-02-15 NOTE — Telephone Encounter (Signed)
This is certain manufacturer's - recall.  If she is wanting to come off the medication and previously has been unable to tolerate increasing losartan dose, then will have to add a medication.  Can try amlodipine 2.5mg  q day.  Will need to follow pressures.

## 2017-02-16 ENCOUNTER — Other Ambulatory Visit: Payer: Self-pay | Admitting: Internal Medicine

## 2017-02-16 DIAGNOSIS — I10 Essential (primary) hypertension: Secondary | ICD-10-CM

## 2017-02-16 MED ORDER — HYDROCHLOROTHIAZIDE 25 MG PO TABS: 25.0000 mg | ORAL_TABLET | Freq: Every day | ORAL | 1 refills | Status: DC

## 2017-02-16 NOTE — Telephone Encounter (Signed)
Patient is requesting refill on HCTZ. She contacted the pharmacy and hers was not recalled. OK to send in refill?

## 2017-02-16 NOTE — Telephone Encounter (Signed)
rx sent in to [facility] for hctz.

## 2017-02-16 NOTE — Telephone Encounter (Signed)
Patient requesting for the nurse to contact her. Would not give further information.

## 2017-02-16 NOTE — Progress Notes (Signed)
rx sent in for hctz.

## 2017-03-29 ENCOUNTER — Ambulatory Visit: Payer: Commercial Managed Care - HMO

## 2017-05-19 ENCOUNTER — Telehealth: Payer: Self-pay | Admitting: Internal Medicine

## 2017-05-19 NOTE — Telephone Encounter (Signed)
Please advise 

## 2017-05-19 NOTE — Telephone Encounter (Signed)
Copied from [place]. Topic: Quick Communication - See Telephone Encounter >> May 19, 2017  2:41 PM Ivar Drape wrote: CRM for notification. See Telephone encounter for:  05/19/17. Patient wants a call back concerning her BP medication.

## 2017-05-20 NOTE — Telephone Encounter (Signed)
Patient stated to disregard. She did not have any concerns. It had been taken care of,

## 2017-05-24 ENCOUNTER — Other Ambulatory Visit: Payer: Self-pay | Admitting: Internal Medicine

## 2017-07-05 ENCOUNTER — Other Ambulatory Visit: Payer: Self-pay | Admitting: Internal Medicine

## 2017-07-05 DIAGNOSIS — I10 Essential (primary) hypertension: Secondary | ICD-10-CM

## 2017-09-22 DIAGNOSIS — Z0100 Encounter for examination of eyes and vision without abnormal findings: Secondary | ICD-10-CM | POA: Diagnosis not present

## 2017-09-22 DIAGNOSIS — H524 Presbyopia: Secondary | ICD-10-CM | POA: Diagnosis not present

## 2017-09-22 DIAGNOSIS — I10 Essential (primary) hypertension: Secondary | ICD-10-CM | POA: Diagnosis not present

## 2017-10-13 ENCOUNTER — Other Ambulatory Visit: Payer: Self-pay | Admitting: Internal Medicine

## 2017-10-22 ENCOUNTER — Ambulatory Visit (INDEPENDENT_AMBULATORY_CARE_PROVIDER_SITE_OTHER): Payer: Medicare HMO | Admitting: Internal Medicine

## 2017-10-22 VITALS — BP 150/90 | HR 90 | Temp 98.1°F | Resp 18 | Ht 66.0 in | Wt 219.4 lb

## 2017-10-22 DIAGNOSIS — Z0000 Encounter for general adult medical examination without abnormal findings: Secondary | ICD-10-CM

## 2017-10-22 DIAGNOSIS — D473 Essential (hemorrhagic) thrombocythemia: Secondary | ICD-10-CM | POA: Diagnosis not present

## 2017-10-22 DIAGNOSIS — Z6835 Body mass index (BMI) 35.0-35.9, adult: Secondary | ICD-10-CM | POA: Diagnosis not present

## 2017-10-22 DIAGNOSIS — R739 Hyperglycemia, unspecified: Secondary | ICD-10-CM

## 2017-10-22 DIAGNOSIS — I10 Essential (primary) hypertension: Secondary | ICD-10-CM

## 2017-10-22 DIAGNOSIS — D75839 Thrombocytosis, unspecified: Secondary | ICD-10-CM

## 2017-10-22 DIAGNOSIS — D649 Anemia, unspecified: Secondary | ICD-10-CM

## 2017-10-22 NOTE — Progress Notes (Signed)
Patient ID: Kylie Diaz, female   DOB: 13-Apr-1945, 72 y.o.   MRN: [id number]   Subjective:    Patient ID: Kylie Diaz, female    DOB: March 21, 1945, 72 y.o.   MRN: [id number]  HPI  Patient here for her physical exam.  She reports she is doing relatively well.  Handling stress better.  Trying to stay active.  Planning to exercise more.  No chest pain.  No sob.  No acid reflux.  No abdominal pain.  Bowels moving.  Blood pressure has been doing relatively well on her checks.  Taking her medications regularly.     Past Medical History:  Diagnosis Date  . BMI 34.0-34.9,adult 04/11/2013  . Hypertension    Past Surgical History:  Procedure Laterality Date  . ABDOMINAL HYSTERECTOMY    . VAGINAL DELIVERY  2   Family History  Problem Relation Age of Onset  . Cancer Mother        unsure type  . Dementia Mother   . Aneurysm Sister   . Cancer Sister   . Cancer Brother        STOMACH  . Diabetes Brother   . Diabetes Sister   . Heart disease Sister    Social History   Socioeconomic History  . Marital status: Married    Spouse name: Not on file  . Number of children: Not on file  . Years of education: Not on file  . Highest education level: Not on file  Occupational History  . Not on file  Social Needs  . Financial resource strain: Not on file  . Food insecurity:    Worry: Not on file    Inability: Not on file  . Transportation needs:    Medical: Not on file    Non-medical: Not on file  Tobacco Use  . Smoking status: Former Smoker    Types: Cigarettes    Last attempt to quit: 11/03/1978    Years since quitting: 39.0  . Smokeless tobacco: Never Used  . Tobacco comment: 1980  Substance and Sexual Activity  . Alcohol use: Yes    Comment: social  . Drug use: No  . Sexual activity: Not Currently    Partners: Male    Birth control/protection: Surgical    Comment: hysterectomy.  Lifestyle  . Physical activity:    Days per week: Not on file    Minutes per session: Not on  file  . Stress: Not on file  Relationships  . Social connections:    Talks on phone: Not on file    Gets together: Not on file    Attends religious service: Not on file    Active member of club or organization: Not on file    Attends meetings of clubs or organizations: Not on file    Relationship status: Not on file  Other Topics Concern  . Not on file  Social History Narrative   Lives in [place].      School - BA in [employer]      Work - Oceanographer    Outpatient Encounter Medications as of 10/22/2017  Medication Sig  . Calcium Carbonate-Vitamin D (CALCIUM-VITAMIN D) 500-200 MG-UNIT per tablet Take 2 tablets by mouth 3 (three) times daily with meals.  . hydrochlorothiazide (HYDRODIURIL) 25 MG tablet TAKE 1 TABLET EVERY DAY  . losartan (COZAAR) 25 MG tablet TAKE 1 TABLET EVERY DAY  . Omega-3 Fatty Acids (FISH OIL) 1000 MG CAPS Take 1 capsule by mouth  daily.  . Potassium (POTASSIMIN PO) Take by mouth as needed.  . vitamin E 100 UNIT capsule Take 100 Units by mouth daily.   No facility-administered encounter medications on file as of 10/22/2017.     Review of Systems  Constitutional: Negative for appetite change and unexpected weight change.  HENT: Negative for congestion and sinus pressure.   Eyes: Negative for pain and visual disturbance.  Respiratory: Negative for cough, chest tightness and shortness of breath.   Cardiovascular: Negative for chest pain, palpitations and leg swelling.  Gastrointestinal: Negative for abdominal pain, constipation and diarrhea.  Genitourinary: Negative for difficulty urinating and dysuria.  Musculoskeletal: Negative for joint swelling and myalgias.  Skin: Negative for color change and rash.  Neurological: Negative for dizziness, light-headedness and headaches.  Hematological: Negative for adenopathy. Does not bruise/bleed easily.  Psychiatric/Behavioral: Negative for agitation and dysphoric mood.       Objective:    Physical  Exam  Constitutional: She is oriented to person, place, and time. She appears well-developed and well-nourished. No distress.  HENT:  Nose: Nose normal.  Mouth/Throat: Oropharynx is clear and moist.  Eyes: Right eye exhibits no discharge. Left eye exhibits no discharge. No scleral icterus.  Neck: Neck supple. No thyromegaly present.  Cardiovascular: Normal rate and regular rhythm.  Pulmonary/Chest: Breath sounds normal. No accessory muscle usage. No tachypnea. No respiratory distress. She has no decreased breath sounds. She has no wheezes. She has no rhonchi. Right breast exhibits no inverted nipple, no mass, no nipple discharge and no tenderness (no axillary adenopathy). Left breast exhibits no inverted nipple, no mass, no nipple discharge and no tenderness (no axilarry adenopathy).  Abdominal: Soft. Bowel sounds are normal. There is no tenderness.  Musculoskeletal: She exhibits no edema or tenderness.  Lymphadenopathy:    She has no cervical adenopathy.  Neurological: She is alert and oriented to person, place, and time.  Skin: Skin is warm. No rash noted.  Psychiatric: She has a normal mood and affect. Her behavior is normal.    BP (!) 150/90 (BP Location: Left Arm, Patient Position: Sitting, Cuff Size: Large)   Pulse 90   Temp 98.1 F (36.7 C) (Oral)   Resp 18   Ht 5\' 6"  (1.676 m)   Wt 219 lb 6.4 oz (99.5 kg)   SpO2 97%   BMI 35.41 kg/m  Wt Readings from Last 3 Encounters:  10/22/17 219 lb 6.4 oz (99.5 kg)  12/29/16 220 lb 9.6 oz (100.1 kg)  10/20/16 220 lb 6.4 oz (100 kg)     Lab Results  Component Value Date   WBC 7.0 09/15/2016   HGB 12.1 09/15/2016   HCT 37.0 09/15/2016   PLT 394 10/20/2016   GLUCOSE 81 12/29/2016   CHOL 177 09/15/2016   TRIG 80.0 09/15/2016   HDL 64.60 09/15/2016   LDLCALC 96 09/15/2016   ALT 9 09/15/2016   AST 13 09/15/2016   NA 141 12/29/2016   K 4.3 12/29/2016   CL 104 12/29/2016   CREATININE 0.85 12/29/2016   BUN 11 12/29/2016   CO2  27 12/29/2016   TSH 1.36 04/15/2016   HGBA1C 5.7 04/17/2014   MICROALBUR 0.7 05/01/2015    Mm Digital Screening Bilateral  Result Date: 08/14/2016 CLINICAL DATA:  Screening. EXAM: DIGITAL SCREENING BILATERAL MAMMOGRAM WITH CAD COMPARISON:  Previous exam(s). ACR Breast Density Category b: There are scattered areas of fibroglandular density. FINDINGS: There are no findings suspicious for malignancy. Images were processed with CAD. IMPRESSION: No mammographic evidence of  malignancy. A result letter of this screening mammogram will be mailed directly to the patient. RECOMMENDATION: Screening mammogram in one year. (Code:SM-B-01Y) BI-RADS CATEGORY  1: Negative. Electronically Signed   By: Dorise Bullion III M.D   On: 08/14/2016 11:01       Assessment & Plan:   Problem List Items Addressed This Visit    Anemia    Follow cbc.       Relevant Orders   CBC with Differential/Platelet   BMI 35.0-35.9,adult    Discussed diet and exercise.  Follow.        Healthcare maintenance    Physical today 10/22/17.  Mammogram 08/14/16 birads I.  Wants to skip this year.  Does not want f/u mammogram until next year.  Discussed cologuard again with her.  She never turned in last. States will do this time.  Discussed bone density.  Notify me when agreeable.        Hypertension    Blood pressure on recheck improved.  Continue current medication.  Follow pressures.  Follow metabolic panel.        Relevant Orders   Lipid panel   TSH   Hepatic function panel   Basic metabolic panel   Thrombocytosis (HCC)    Follow cbc.        Other Visit Diagnoses    Routine general medical examination at a health care facility    -  Primary   Hyperglycemia       Relevant Orders   Hemoglobin A1c       Einar Pheasant, MD

## 2017-10-24 ENCOUNTER — Encounter: Payer: Self-pay | Admitting: Internal Medicine

## 2017-10-24 NOTE — Assessment & Plan Note (Signed)
Discussed diet and exercise.  Follow.  

## 2017-10-24 NOTE — Assessment & Plan Note (Signed)
Blood pressure on recheck improved.  Continue current medication.  Follow pressures.  Follow metabolic panel.

## 2017-10-24 NOTE — Assessment & Plan Note (Signed)
Follow cbc.  

## 2017-10-24 NOTE — Assessment & Plan Note (Signed)
Physical today 10/22/17.  Mammogram 08/14/16 birads I.  Wants to skip this year.  Does not want f/u mammogram until next year.  Discussed cologuard again with her.  She never turned in last. States will do this time.  Discussed bone density.  Notify me when agreeable.

## 2017-10-26 ENCOUNTER — Other Ambulatory Visit (INDEPENDENT_AMBULATORY_CARE_PROVIDER_SITE_OTHER): Payer: Medicare HMO

## 2017-10-26 DIAGNOSIS — D649 Anemia, unspecified: Secondary | ICD-10-CM

## 2017-10-26 DIAGNOSIS — I10 Essential (primary) hypertension: Secondary | ICD-10-CM

## 2017-10-26 DIAGNOSIS — R739 Hyperglycemia, unspecified: Secondary | ICD-10-CM

## 2017-10-26 LAB — CBC WITH DIFFERENTIAL/PLATELET
BASOS ABS: 0 10*3/uL (ref 0.0–0.1)
Basophils Relative: 0.6 % (ref 0.0–3.0)
EOS PCT: 1.5 % (ref 0.0–5.0)
Eosinophils Absolute: 0.1 10*3/uL (ref 0.0–0.7)
HCT: 35.6 % — ABNORMAL LOW (ref 36.0–46.0)
HEMOGLOBIN: 11.4 g/dL — AB (ref 12.0–15.0)
LYMPHS ABS: 1.7 10*3/uL (ref 0.7–4.0)
Lymphocytes Relative: 30.5 % (ref 12.0–46.0)
MCHC: 32.1 g/dL (ref 30.0–36.0)
MCV: 86.7 fl (ref 78.0–100.0)
MONO ABS: 0.4 10*3/uL (ref 0.1–1.0)
MONOS PCT: 7.1 % (ref 3.0–12.0)
Neutro Abs: 3.4 10*3/uL (ref 1.4–7.7)
Neutrophils Relative %: 60.3 % (ref 43.0–77.0)
Platelets: 365 10*3/uL (ref 150.0–400.0)
RBC: 4.11 Mil/uL (ref 3.87–5.11)
RDW: 14.1 % (ref 11.5–15.5)
WBC: 5.7 10*3/uL (ref 4.0–10.5)

## 2017-10-26 LAB — HEPATIC FUNCTION PANEL
ALT: 9 U/L (ref 0–35)
AST: 13 U/L (ref 0–37)
Albumin: 4.2 g/dL (ref 3.5–5.2)
Alkaline Phosphatase: 58 U/L (ref 39–117)
BILIRUBIN TOTAL: 0.6 mg/dL (ref 0.2–1.2)
Bilirubin, Direct: 0.1 mg/dL (ref 0.0–0.3)
TOTAL PROTEIN: 7.2 g/dL (ref 6.0–8.3)

## 2017-10-26 LAB — HEMOGLOBIN A1C: Hgb A1c MFr Bld: 5.3 % (ref 4.6–6.5)

## 2017-10-26 LAB — LIPID PANEL
CHOLESTEROL: 170 mg/dL (ref 0–200)
HDL: 58.1 mg/dL (ref >39.00)
LDL Cholesterol: 93 mg/dL (ref 0–99)
NONHDL: 112
Total CHOL/HDL Ratio: 3
Triglycerides: 94 mg/dL (ref 0.0–149.0)
VLDL: 18.8 mg/dL (ref 0.0–40.0)

## 2017-10-26 LAB — BASIC METABOLIC PANEL
BUN: 14 mg/dL (ref 6–23)
CALCIUM: 9.9 mg/dL (ref 8.4–10.5)
CHLORIDE: 106 meq/L (ref 96–112)
CO2: 28 meq/L (ref 19–32)
Creatinine, Ser: 0.86 mg/dL (ref 0.40–1.20)
GFR: 83.63 mL/min (ref >60.00)
GLUCOSE: 96 mg/dL (ref 70–99)
Potassium: 4.4 mEq/L (ref 3.5–5.1)
Sodium: 140 mEq/L (ref 135–145)

## 2017-10-26 LAB — TSH: TSH: 0.73 u[IU]/mL (ref 0.35–4.50)

## 2017-10-27 ENCOUNTER — Other Ambulatory Visit: Payer: Self-pay | Admitting: Internal Medicine

## 2017-10-27 ENCOUNTER — Other Ambulatory Visit: Payer: Self-pay | Admitting: *Deleted

## 2017-10-27 DIAGNOSIS — D649 Anemia, unspecified: Secondary | ICD-10-CM

## 2017-10-27 NOTE — Progress Notes (Signed)
Orders placed for f/u labs.  

## 2017-11-24 ENCOUNTER — Other Ambulatory Visit: Payer: Self-pay | Admitting: Internal Medicine

## 2017-11-24 DIAGNOSIS — I10 Essential (primary) hypertension: Secondary | ICD-10-CM

## 2017-12-01 ENCOUNTER — Other Ambulatory Visit: Payer: Medicare HMO

## 2017-12-08 ENCOUNTER — Other Ambulatory Visit (INDEPENDENT_AMBULATORY_CARE_PROVIDER_SITE_OTHER): Payer: Medicare HMO

## 2017-12-08 DIAGNOSIS — D649 Anemia, unspecified: Secondary | ICD-10-CM

## 2017-12-09 LAB — CBC WITH DIFFERENTIAL/PLATELET
BASOS PCT: 0.9 % (ref 0.0–3.0)
Basophils Absolute: 0.1 10*3/uL (ref 0.0–0.1)
Eosinophils Absolute: 0.1 10*3/uL (ref 0.0–0.7)
Eosinophils Relative: 0.8 % (ref 0.0–5.0)
HEMATOCRIT: 35.6 % — AB (ref 36.0–46.0)
HEMOGLOBIN: 11.4 g/dL — AB (ref 12.0–15.0)
Lymphocytes Relative: 25.3 % (ref 12.0–46.0)
Lymphs Abs: 1.8 10*3/uL (ref 0.7–4.0)
MCHC: 32.1 g/dL (ref 30.0–36.0)
MCV: 86.4 fl (ref 78.0–100.0)
MONO ABS: 0.5 10*3/uL (ref 0.1–1.0)
Monocytes Relative: 7.7 % (ref 3.0–12.0)
Neutro Abs: 4.6 10*3/uL (ref 1.4–7.7)
Neutrophils Relative %: 65.3 % (ref 43.0–77.0)
Platelets: 377 10*3/uL (ref 150.0–400.0)
RBC: 4.12 Mil/uL (ref 3.87–5.11)
RDW: 14.3 % (ref 11.5–15.5)
WBC: 7 10*3/uL (ref 4.0–10.5)

## 2017-12-09 LAB — VITAMIN B12: VITAMIN B 12: 481 pg/mL (ref 211–911)

## 2017-12-09 LAB — HEPATIC FUNCTION PANEL
ALK PHOS: 57 U/L (ref 39–117)
ALT: 12 U/L (ref 0–35)
AST: 13 U/L (ref 0–37)
Albumin: 4.2 g/dL (ref 3.5–5.2)
BILIRUBIN DIRECT: 0.1 mg/dL (ref 0.0–0.3)
TOTAL PROTEIN: 7.3 g/dL (ref 6.0–8.3)
Total Bilirubin: 0.6 mg/dL (ref 0.2–1.2)

## 2017-12-09 LAB — IRON,TIBC AND FERRITIN PANEL
%SAT: 27 % (calc) (ref 16–45)
FERRITIN: 275 ng/mL (ref 16–288)
Iron: 89 ug/dL (ref 45–160)
TIBC: 330 mcg/dL (calc) (ref 250–450)

## 2017-12-09 LAB — IBC PANEL
IRON: 95 ug/dL (ref 42–145)
SATURATION RATIOS: 25.4 % (ref 20.0–50.0)
TRANSFERRIN: 267 mg/dL (ref 212.0–360.0)

## 2017-12-09 LAB — FERRITIN: Ferritin: 244.1 ng/mL (ref 10.0–291.0)

## 2017-12-13 ENCOUNTER — Telehealth: Payer: Self-pay

## 2017-12-13 NOTE — Telephone Encounter (Signed)
Copied from [place] [id number]. Topic: General - Other >> Dec 13, 2017 10:11 AM Janace Aris A wrote: Reason for CRM: patient called in requesting a call back regarding her medication, patient says it's all of them. No other information given

## 2017-12-13 NOTE — Telephone Encounter (Signed)
Patient is wanting to know if collagen peptide can effect her cholesterol levels

## 2017-12-13 NOTE — Telephone Encounter (Signed)
In reviewing the literature, I cannot find any adverse effects on cholesterol levels.  Still would be careful with large amounts.  Also confirm why she is taking (I.e., joint aching, etc)

## 2017-12-16 NOTE — Telephone Encounter (Signed)
Patient takes it for over all health factors. She says she takes it to avoid having any symptoms like joint aching, etc.

## 2018-02-24 ENCOUNTER — Ambulatory Visit (INDEPENDENT_AMBULATORY_CARE_PROVIDER_SITE_OTHER): Payer: Medicare HMO | Admitting: Internal Medicine

## 2018-02-24 ENCOUNTER — Encounter: Payer: Self-pay | Admitting: Internal Medicine

## 2018-02-24 VITALS — BP 130/80 | HR 94 | Temp 98.0°F | Resp 16 | Wt 218.0 lb

## 2018-02-24 DIAGNOSIS — Z6835 Body mass index (BMI) 35.0-35.9, adult: Secondary | ICD-10-CM | POA: Diagnosis not present

## 2018-02-24 DIAGNOSIS — D473 Essential (hemorrhagic) thrombocythemia: Secondary | ICD-10-CM

## 2018-02-24 DIAGNOSIS — I10 Essential (primary) hypertension: Secondary | ICD-10-CM | POA: Diagnosis not present

## 2018-02-24 DIAGNOSIS — D649 Anemia, unspecified: Secondary | ICD-10-CM

## 2018-02-24 DIAGNOSIS — D75839 Thrombocytosis, unspecified: Secondary | ICD-10-CM

## 2018-02-24 DIAGNOSIS — R739 Hyperglycemia, unspecified: Secondary | ICD-10-CM

## 2018-02-24 NOTE — Progress Notes (Signed)
Patient ID: Kylie Diaz, female   DOB: 1945-11-29, 72 y.o.   MRN: [id number]   Subjective:    Patient ID: Kylie Diaz, female    DOB: 1945/06/18, 72 y.o.   MRN: [id number]  HPI  Patient here for a scheduled follow up.  She reports she is doing better.  Trying to stay active.  No chest pain.  No sob.  No acid reflux.  No abdominal pain.  Bowels moving.  No urine change.  Handling stress.  Discussed immunizations.  Will think about shnigrx.  Declines other immunizations.     Past Medical History:  Diagnosis Date  . BMI 34.0-34.9,adult 04/11/2013  . Hypertension    Past Surgical History:  Procedure Laterality Date  . ABDOMINAL HYSTERECTOMY    . VAGINAL DELIVERY  2   Family History  Problem Relation Age of Onset  . Cancer Mother        unsure type  . Dementia Mother   . Aneurysm Sister   . Cancer Sister   . Cancer Brother        STOMACH  . Diabetes Brother   . Diabetes Sister   . Heart disease Sister    Social History   Socioeconomic History  . Marital status: Married    Spouse name: Not on file  . Number of children: Not on file  . Years of education: Not on file  . Highest education level: Not on file  Occupational History  . Not on file  Social Needs  . Financial resource strain: Not on file  . Food insecurity:    Worry: Not on file    Inability: Not on file  . Transportation needs:    Medical: Not on file    Non-medical: Not on file  Tobacco Use  . Smoking status: Former Smoker    Types: Cigarettes    Last attempt to quit: 11/03/1978    Years since quitting: 39.3  . Smokeless tobacco: Never Used  . Tobacco comment: 1980  Substance and Sexual Activity  . Alcohol use: Yes    Comment: social  . Drug use: No  . Sexual activity: Not Currently    Partners: Male    Birth control/protection: Surgical    Comment: hysterectomy.  Lifestyle  . Physical activity:    Days per week: Not on file    Minutes per session: Not on file  . Stress: Not on file    Relationships  . Social connections:    Talks on phone: Not on file    Gets together: Not on file    Attends religious service: Not on file    Active member of club or organization: Not on file    Attends meetings of clubs or organizations: Not on file    Relationship status: Not on file  Other Topics Concern  . Not on file  Social History Narrative   Lives in [place].      School - BA in [employer]      Work - Oceanographer    Outpatient Encounter Medications as of 02/24/2018  Medication Sig  . Calcium Carbonate-Vitamin D (CALCIUM-VITAMIN D) 500-200 MG-UNIT per tablet Take 2 tablets by mouth 3 (three) times daily with meals.  . hydrochlorothiazide (HYDRODIURIL) 25 MG tablet TAKE 1 TABLET EVERY DAY  . losartan (COZAAR) 25 MG tablet TAKE 1 TABLET EVERY DAY  . Omega-3 Fatty Acids (FISH OIL) 1000 MG CAPS Take 1 capsule by mouth daily.  . Potassium (POTASSIMIN  PO) Take by mouth as needed.  . vitamin E 100 UNIT capsule Take 100 Units by mouth daily.   No facility-administered encounter medications on file as of 02/24/2018.     Review of Systems  Constitutional: Negative for appetite change and unexpected weight change.  HENT: Negative for congestion and sinus pressure.   Respiratory: Negative for cough, chest tightness and shortness of breath.   Cardiovascular: Negative for chest pain, palpitations and leg swelling.  Gastrointestinal: Negative for abdominal pain, diarrhea, nausea and vomiting.  Genitourinary: Negative for difficulty urinating and dysuria.  Musculoskeletal: Negative for joint swelling and myalgias.  Skin: Negative for color change and rash.  Neurological: Negative for dizziness, light-headedness and headaches.  Psychiatric/Behavioral: Negative for agitation and dysphoric mood.       Objective:    Physical Exam Constitutional:      General: She is not in acute distress.    Appearance: Normal appearance.  HENT:     Nose: Nose normal. No  congestion.     Mouth/Throat:     Pharynx: No oropharyngeal exudate or posterior oropharyngeal erythema.  Neck:     Musculoskeletal: Neck supple. No muscular tenderness.     Thyroid: No thyromegaly.  Cardiovascular:     Rate and Rhythm: Normal rate and regular rhythm.  Pulmonary:     Effort: No respiratory distress.     Breath sounds: Normal breath sounds. No wheezing.  Abdominal:     General: Bowel sounds are normal.     Palpations: Abdomen is soft.     Tenderness: There is no abdominal tenderness.  Musculoskeletal:        General: No swelling or tenderness.  Lymphadenopathy:     Cervical: No cervical adenopathy.  Skin:    Findings: No erythema or rash.  Neurological:     Mental Status: She is alert.  Psychiatric:        Mood and Affect: Mood normal.        Thought Content: Thought content normal.     BP 130/80 (BP Location: Left Arm, Patient Position: Sitting, Cuff Size: Normal)   Pulse 94   Temp 98 F (36.7 C) (Oral)   Resp 16   Wt 218 lb (98.9 kg)   SpO2 96%   BMI 35.19 kg/m  Wt Readings from Last 3 Encounters:  02/24/18 218 lb (98.9 kg)  10/22/17 219 lb 6.4 oz (99.5 kg)  12/29/16 220 lb 9.6 oz (100.1 kg)     Lab Results  Component Value Date   WBC 7.0 12/08/2017   HGB 11.4 (L) 12/08/2017   HCT 35.6 (L) 12/08/2017   PLT 377.0 12/08/2017   GLUCOSE 96 10/26/2017   CHOL 170 10/26/2017   TRIG 94.0 10/26/2017   HDL 58.10 10/26/2017   LDLCALC 93 10/26/2017   ALT 12 12/08/2017   AST 13 12/08/2017   NA 140 10/26/2017   K 4.4 10/26/2017   CL 106 10/26/2017   CREATININE 0.86 10/26/2017   BUN 14 10/26/2017   CO2 28 10/26/2017   TSH 0.73 10/26/2017   HGBA1C 5.3 10/26/2017   MICROALBUR 0.7 05/01/2015    Mm Digital Screening Bilateral  Result Date: 08/14/2016 CLINICAL DATA:  Screening. EXAM: DIGITAL SCREENING BILATERAL MAMMOGRAM WITH CAD COMPARISON:  Previous exam(s). ACR Breast Density Category b: There are scattered areas of fibroglandular density.  FINDINGS: There are no findings suspicious for malignancy. Images were processed with CAD. IMPRESSION: No mammographic evidence of malignancy. A result letter of this screening mammogram will be mailed directly  to the patient. RECOMMENDATION: Screening mammogram in one year. (Code:SM-B-01Y) BI-RADS CATEGORY  1: Negative. Electronically Signed   By: Dorise Bullion III M.D   On: 08/14/2016 11:01       Assessment & Plan:   Problem List Items Addressed This Visit    Anemia    Follow cbc.       Relevant Orders   Ferritin   CBC with Differential/Platelet   BMI 35.0-35.9,adult    Diet and exercise.  Follow.        Hypertension    Blood pressure under good control.  Continue same medication regimen.  Follow pressures.  Follow metabolic panel.        Relevant Orders   Hepatic function panel   Lipid panel   Basic metabolic panel   Thrombocytosis (HCC)    Platelet count 11/2017 - wnl.  Follow.         Other Visit Diagnoses    Hyperglycemia    -  Primary   Relevant Orders   Hemoglobin A1c       Einar Pheasant, MD

## 2018-03-05 ENCOUNTER — Encounter: Payer: Self-pay | Admitting: Internal Medicine

## 2018-03-05 NOTE — Assessment & Plan Note (Signed)
Diet and exercise.  Follow.  

## 2018-03-05 NOTE — Assessment & Plan Note (Signed)
Blood pressure under good control.  Continue same medication regimen.  Follow pressures.  Follow metabolic panel.   

## 2018-03-05 NOTE — Assessment & Plan Note (Signed)
Platelet count 11/2017 - wnl.  Follow.

## 2018-03-05 NOTE — Assessment & Plan Note (Signed)
Follow cbc.  

## 2018-03-08 ENCOUNTER — Other Ambulatory Visit: Payer: Self-pay | Admitting: Internal Medicine

## 2018-04-19 ENCOUNTER — Other Ambulatory Visit: Payer: Self-pay | Admitting: Internal Medicine

## 2018-04-19 DIAGNOSIS — I10 Essential (primary) hypertension: Secondary | ICD-10-CM

## 2018-04-20 ENCOUNTER — Other Ambulatory Visit (INDEPENDENT_AMBULATORY_CARE_PROVIDER_SITE_OTHER): Payer: Medicare HMO

## 2018-04-20 ENCOUNTER — Encounter: Payer: Self-pay | Admitting: Internal Medicine

## 2018-04-20 DIAGNOSIS — I10 Essential (primary) hypertension: Secondary | ICD-10-CM

## 2018-04-20 DIAGNOSIS — D649 Anemia, unspecified: Secondary | ICD-10-CM

## 2018-04-20 DIAGNOSIS — R739 Hyperglycemia, unspecified: Secondary | ICD-10-CM

## 2018-04-20 LAB — BASIC METABOLIC PANEL
BUN: 10 mg/dL (ref 6–23)
CO2: 29 mEq/L (ref 19–32)
Calcium: 9.5 mg/dL (ref 8.4–10.5)
Chloride: 106 mEq/L (ref 96–112)
Creatinine, Ser: 0.83 mg/dL (ref 0.40–1.20)
GFR: 81.86 mL/min (ref >60.00)
Glucose, Bld: 91 mg/dL (ref 70–99)
Potassium: 4.1 mEq/L (ref 3.5–5.1)
Sodium: 142 mEq/L (ref 135–145)

## 2018-04-20 LAB — CBC WITH DIFFERENTIAL/PLATELET
BASOS ABS: 0 10*3/uL (ref 0.0–0.1)
Basophils Relative: 0.4 % (ref 0.0–3.0)
Eosinophils Absolute: 0.2 10*3/uL (ref 0.0–0.7)
Eosinophils Relative: 2 % (ref 0.0–5.0)
HCT: 34.9 % — ABNORMAL LOW (ref 36.0–46.0)
Hemoglobin: 11.1 g/dL — ABNORMAL LOW (ref 12.0–15.0)
Lymphocytes Relative: 24 % (ref 12.0–46.0)
Lymphs Abs: 2.2 10*3/uL (ref 0.7–4.0)
MCHC: 31.8 g/dL (ref 30.0–36.0)
MCV: 88.1 fl (ref 78.0–100.0)
Monocytes Absolute: 0.7 10*3/uL (ref 0.1–1.0)
Monocytes Relative: 7.3 % (ref 3.0–12.0)
Neutro Abs: 5.9 10*3/uL (ref 1.4–7.7)
Neutrophils Relative %: 66.3 % (ref 43.0–77.0)
Platelets: 366 10*3/uL (ref 150.0–400.0)
RBC: 3.96 Mil/uL (ref 3.87–5.11)
RDW: 13.7 % (ref 11.5–15.5)
WBC: 9 10*3/uL (ref 4.0–10.5)

## 2018-04-20 LAB — LIPID PANEL
Cholesterol: 175 mg/dL (ref 0–200)
HDL: 48.6 mg/dL (ref >39.00)
LDL Cholesterol: 104 mg/dL — ABNORMAL HIGH (ref 0–99)
NonHDL: 126.82
Total CHOL/HDL Ratio: 4
Triglycerides: 115 mg/dL (ref 0.0–149.0)
VLDL: 23 mg/dL (ref 0.0–40.0)

## 2018-04-20 LAB — HEPATIC FUNCTION PANEL
ALBUMIN: 4.1 g/dL (ref 3.5–5.2)
ALT: 10 U/L (ref 0–35)
AST: 11 U/L (ref 0–37)
Alkaline Phosphatase: 58 U/L (ref 39–117)
Bilirubin, Direct: 0.1 mg/dL (ref 0.0–0.3)
Total Bilirubin: 0.6 mg/dL (ref 0.2–1.2)
Total Protein: 7 g/dL (ref 6.0–8.3)

## 2018-04-20 LAB — HEMOGLOBIN A1C: HEMOGLOBIN A1C: 5.4 % (ref 4.6–6.5)

## 2018-04-20 LAB — FERRITIN: Ferritin: 276.6 ng/mL (ref 10.0–291.0)

## 2018-06-13 ENCOUNTER — Telehealth: Payer: Self-pay

## 2018-06-13 NOTE — Telephone Encounter (Signed)
Copied from [place] [id number]. Topic: General - Call Back - No Documentation >> Jun 13, 2018  3:43 PM Mcneil, Ja-Kwan wrote: Reason for CRM: Pt stated she received a message and she would like to speak with Dr. Bary Leriche nurse. Pt stated it is urgent and requests a call back. Cb# [phone]

## 2018-06-14 NOTE — Telephone Encounter (Signed)
Pt wanting to do virtual visit. I will place on outlook schedule

## 2018-06-14 NOTE — Telephone Encounter (Signed)
lmtcb

## 2018-06-14 NOTE — Telephone Encounter (Signed)
Patient returning call to [place]. Please advise.

## 2018-06-28 ENCOUNTER — Telehealth: Payer: Self-pay | Admitting: Internal Medicine

## 2018-06-28 NOTE — Telephone Encounter (Signed)
Copied from [place] [id number]. Topic: Quick Communication - See Telephone Encounter >> Jun 28, 2018  2:25 PM Robina Ade, Helene Kelp D wrote: CRM for notification. See Telephone encounter for: 06/28/18. Patient called and would like a call back from [place] regarding xray orders form Dr. Nicki Reaper. Please call patient back, thanks.

## 2018-06-30 ENCOUNTER — Encounter: Payer: Self-pay | Admitting: Internal Medicine

## 2018-06-30 NOTE — Telephone Encounter (Signed)
Noted. Per our discussion, pt wants to discuss at appt.  Does not feel needs anything today.

## 2018-06-30 NOTE — Telephone Encounter (Signed)
See Note. Pt would like to have xrays done before appt tomorrow.   On right side of pt stomach a little pain every so often. Pt sates she's not sure if she constipated or not. On right side upper portion below right breast. Please advise? Thank you!

## 2018-06-30 NOTE — Telephone Encounter (Signed)
Spoke with pt to get a little more information. Her last BM was this morning, was not hard or painful. She has been going to the bathroom regularly. Advised that the x-ray would not be beneficial for this. Patient stated that discomfort is on the right side of her abdomen below the breast. It is not constant and is not unbearable. She does not recall doing something that could've caused injury or pulled muscle. States that she is not as active as she used to be. Discomfort comes and goes and is worse when laying down. She denies any fever, nausea, chills, vomit, bodyaches, chest tightness, SOB. This is not something that just started, she says it has been going on for awhile. She does have a virtual visit scheduled for tomorrow and wants to discuss further with you. She is ok with not doing x-rays.

## 2018-06-30 NOTE — Telephone Encounter (Signed)
See phone note for this patient. I have talked with her on the phone

## 2018-07-01 ENCOUNTER — Encounter: Payer: Self-pay | Admitting: Internal Medicine

## 2018-07-01 ENCOUNTER — Ambulatory Visit (INDEPENDENT_AMBULATORY_CARE_PROVIDER_SITE_OTHER): Payer: Medicare HMO | Admitting: Internal Medicine

## 2018-07-01 ENCOUNTER — Other Ambulatory Visit: Payer: Self-pay

## 2018-07-01 DIAGNOSIS — D649 Anemia, unspecified: Secondary | ICD-10-CM | POA: Diagnosis not present

## 2018-07-01 DIAGNOSIS — R109 Unspecified abdominal pain: Secondary | ICD-10-CM

## 2018-07-01 MED ORDER — PANTOPRAZOLE SODIUM 20 MG PO TBEC: 20.0000 mg | DELAYED_RELEASE_TABLET | Freq: Every day | ORAL | 1 refills | Status: DC

## 2018-07-01 NOTE — Progress Notes (Addendum)
Patient ID: Kylie Diaz, female   DOB: 1946-03-05, 73 y.o.   MRN: [id number] Virtual Visit via Telephone Note  This visit type was conducted due to national recommendations for restrictions regarding the COVID-19 pandemic (e.g. social distancing).  This format is felt to be most appropriate for this patient at this time.  All issues noted in this document were discussed and addressed.  No physical exam was performed (except for noted visual exam findings with Video Visits).   I connected with Kylie Diaz on 07/01/18 at  1:30 PM EDT by telephone and verified that I am speaking with the correct person using two identifiers. Location patient: home Location provider: work Persons participating in the telephone visit: patient, provider  I discussed the limitations, risks, security and privacy concerns of performing an evaluation and management service by telephone and the availability of in person appointments.The patient expressed understanding and agreed to proceed.   Reason for visit: acute visit.    HPI: She reports she is doing relatively well.  States she has noticed increased right side discomfort - intermittent.  Localized below her breast.  Notices when she lies down only.  Does not occur during the day.  No pain or discomfort with eating.  No nausea or vomiting.  Bowels moving.  No other abdominal pain.  No urine change.  States noticed 3 months ago.  Intermittent.  Noticed again recently.  States eats coffee and dessert at night.  Reports had similar pain several years ago.  Was seen in ER and followed up with her PCP.  Was started on protonix and symptoms resolved.  No problems until recent as outlined.  No fever.  No cough or congestion.  No sob.     ROS: See pertinent positives and negatives per HPI.  Past Medical History:  Diagnosis Date  . BMI 34.0-34.9,adult 04/11/2013  . Hypertension     Past Surgical History:  Procedure Laterality Date  . ABDOMINAL HYSTERECTOMY    .  VAGINAL DELIVERY  2    Family History  Problem Relation Age of Onset  . Cancer Mother        unsure type  . Dementia Mother   . Aneurysm Sister   . Cancer Sister   . Cancer Brother        STOMACH  . Diabetes Brother   . Diabetes Sister   . Heart disease Sister     SOCIAL HX: reviewed.    Current Outpatient Medications:  .  Calcium Carbonate-Vitamin D (CALCIUM-VITAMIN D) 500-200 MG-UNIT per tablet, Take 2 tablets by mouth 3 (three) times daily with meals., Disp: , Rfl:  .  hydrochlorothiazide (HYDRODIURIL) 25 MG tablet, TAKE 1 TABLET EVERY DAY, Disp: 90 tablet, Rfl: 1 .  losartan (COZAAR) 25 MG tablet, TAKE 1 TABLET EVERY DAY, Disp: 90 tablet, Rfl: 1 .  Omega-3 Fatty Acids (FISH OIL) 1000 MG CAPS, Take 1 capsule by mouth daily., Disp: , Rfl:  .  pantoprazole (PROTONIX) 20 MG tablet, Take 1 tablet (20 mg total) by mouth daily. Take 30 minutes before your evening meal, Disp: 30 tablet, Rfl: 1 .  Potassium (POTASSIMIN PO), Take by mouth as needed., Disp: , Rfl:  .  vitamin E 100 UNIT capsule, Take 100 Units by mouth daily., Disp: , Rfl:   EXAM:  GENERAL: alert.  Sounds to be in no acute distress.  Answering questions appropriately.    PSYCH/NEURO: pleasant and cooperative, no obvious depression or anxiety, speech and thought processing grossly intact  ASSESSMENT AND PLAN:  Discussed the following assessment and plan:  Anemia, unspecified type  Right sided abdominal pain  Anemia Follow cbc.   Right sided abdominal pain Right side abdominal pain as outlined.  Only occurs at night.  Drinks coffee and eats a dessert prior to bed.  Will stop eating and drinking coffee just prior to bed.  Will start protonix 20mg  before her evening meal.  Hold on scanning. Follow up over the next 1-2 weeks.  If persistent problems, will need f/u and possible further testing/scanning.      I discussed the assessment and treatment plan with the patient. The patient was provided an opportunity to  ask questions and all were answered. The patient agreed with the plan and demonstrated an understanding of the instructions.   The patient was advised to call back or seek an in-person evaluation if the symptoms worsen or if the condition fails to improve as anticipated.  I spent 15 minutes of non face to face time during this encounter.   Einar Pheasant, MD

## 2018-07-03 ENCOUNTER — Encounter: Payer: Self-pay | Admitting: Internal Medicine

## 2018-07-03 DIAGNOSIS — R109 Unspecified abdominal pain: Secondary | ICD-10-CM | POA: Insufficient documentation

## 2018-07-03 NOTE — Assessment & Plan Note (Signed)
Follow cbc.  

## 2018-07-03 NOTE — Assessment & Plan Note (Signed)
Right side abdominal pain as outlined.  Only occurs at night.  Drinks coffee and eats a dessert prior to bed.  Will stop eating and drinking coffee just prior to bed.  Will start protonix 20mg  before her evening meal.  Hold on scanning. Follow up over the next 1-2 weeks.  If persistent problems, will need f/u and possible further testing/scanning.

## 2018-07-15 ENCOUNTER — Other Ambulatory Visit: Payer: Self-pay | Admitting: Internal Medicine

## 2018-08-09 ENCOUNTER — Telehealth: Payer: Self-pay | Admitting: *Deleted

## 2018-08-09 NOTE — Telephone Encounter (Signed)
Copied from [place] [id number]. Topic: General - Inquiry >> Aug 09, 2018 11:26 AM Margot Ables wrote: Reason for CRM: pt called in asking to speak to Rasheeda. Pt did not want to elaborate upon me asking just that she would like to speak to Why. Please return call.

## 2018-08-15 ENCOUNTER — Ambulatory Visit (INDEPENDENT_AMBULATORY_CARE_PROVIDER_SITE_OTHER): Payer: Medicare HMO | Admitting: Internal Medicine

## 2018-08-15 ENCOUNTER — Other Ambulatory Visit: Payer: Self-pay

## 2018-08-15 ENCOUNTER — Encounter: Payer: Self-pay | Admitting: Internal Medicine

## 2018-08-15 DIAGNOSIS — D649 Anemia, unspecified: Secondary | ICD-10-CM | POA: Diagnosis not present

## 2018-08-15 DIAGNOSIS — I10 Essential (primary) hypertension: Secondary | ICD-10-CM

## 2018-08-15 DIAGNOSIS — K219 Gastro-esophageal reflux disease without esophagitis: Secondary | ICD-10-CM

## 2018-08-15 DIAGNOSIS — D473 Essential (hemorrhagic) thrombocythemia: Secondary | ICD-10-CM

## 2018-08-15 DIAGNOSIS — R109 Unspecified abdominal pain: Secondary | ICD-10-CM

## 2018-08-15 DIAGNOSIS — D75839 Thrombocytosis, unspecified: Secondary | ICD-10-CM

## 2018-08-15 NOTE — Progress Notes (Signed)
Patient ID: Kylie Diaz, female   DOB: 1945-04-02, 73 y.o.   MRN: [id number]   Virtual Visit via telephone Note  This visit type was conducted due to national recommendations for restrictions regarding the COVID-19 pandemic (e.g. social distancing).  This format is felt to be most appropriate for this patient at this time.  All issues noted in this document were discussed and addressed.  No physical exam was performed (except for noted visual exam findings with Video Visits).   I connected with Kylie Diaz by telephone and verified that I am speaking with the correct person using two identifiers. Location patient: home Location provider: work Persons participating in the telephone visit: patient, provider  I discussed the limitations, risks, security and privacy concerns of performing an evaluation and management service by telephone and the availability of in person appointments.  The patient expressed understanding and agreed to proceed.   Reason for visit: acute visit.   HPI: She was evaluated 07/01/18 - right side discomfort.  Noticed when she was lying down.  At that time, she felt was related to coffee and dessert prior to going to bed.  Was started on protonix.  States still having intermittent discomfort.  No known trigger.  No pain currently.  No acid reflux.  No other abdominal pain.  Bowels moving.  No nausea or vomiting.  No fever.  Trying to stay in secondary to COVID restrictions.  No cough, chest congestion or sob.     ROS: See pertinent positives and negatives per HPI.  Past Medical History:  Diagnosis Date  . BMI 34.0-34.9,adult 04/11/2013  . Hypertension     Past Surgical History:  Procedure Laterality Date  . ABDOMINAL HYSTERECTOMY    . VAGINAL DELIVERY  2    Family History  Problem Relation Age of Onset  . Cancer Mother        unsure type  . Dementia Mother   . Aneurysm Sister   . Cancer Sister   . Cancer Brother        STOMACH  . Diabetes Brother    . Diabetes Sister   . Heart disease Sister     SOCIAL HX: reviewed.    Current Outpatient Medications:  .  Calcium Carbonate-Vitamin D (CALCIUM-VITAMIN D) 500-200 MG-UNIT per tablet, Take 2 tablets by mouth 3 (three) times daily with meals., Disp: , Rfl:  .  hydrochlorothiazide (HYDRODIURIL) 25 MG tablet, TAKE 1 TABLET EVERY DAY, Disp: 90 tablet, Rfl: 1 .  losartan (COZAAR) 25 MG tablet, TAKE 1 TABLET EVERY DAY, Disp: 90 tablet, Rfl: 1 .  Omega-3 Fatty Acids (FISH OIL) 1000 MG CAPS, Take 1 capsule by mouth daily., Disp: , Rfl:  .  pantoprazole (PROTONIX) 20 MG tablet, Take 1 tablet (20 mg total) by mouth daily. Take 30 minutes before your evening meal, Disp: 30 tablet, Rfl: 1 .  Potassium (POTASSIMIN PO), Take by mouth as needed., Disp: , Rfl:  .  vitamin E 100 UNIT capsule, Take 100 Units by mouth daily., Disp: , Rfl:   EXAM:  GENERAL: alert. Answering questions appropriately.  Sounds to be in no acute distress.    PSYCH/NEURO: pleasant and cooperative, no obvious depression or anxiety, speech and thought processing grossly intact  ASSESSMENT AND PLAN:  Discussed the following assessment and plan:  Anemia, unspecified type  Gastroesophageal reflux disease, esophagitis presence not specified  Essential hypertension  Thrombocytosis (HCC)  Right sided abdominal pain - Plan: US Abdomen Complete  Anemia Follow cbc.  Esophageal reflux On protonix.  No actual reflux symptoms.  Follow.    Hypertension Blood pressure has been doing well.  Follow.    Thrombocytosis (HCC) Platelet count - wnl on last check.  Follow.    Right sided abdominal pain Continued right side pain as outlined.  On protonix.  Still intermittent flares.  Will check abdominal ultrasound.  Further w/up pending results.  Pt in agreement.      I discussed the assessment and treatment plan with the patient. The patient was provided an opportunity to ask questions and all were answered. The patient agreed  with the plan and demonstrated an understanding of the instructions.   The patient was advised to call back or seek an in-person evaluation if the symptoms worsen or if the condition fails to improve as anticipated.  I provided 15 minutes of non-face-to-face time during this encounter.   Einar Pheasant, MD

## 2018-08-20 ENCOUNTER — Encounter: Payer: Self-pay | Admitting: Internal Medicine

## 2018-08-20 NOTE — Assessment & Plan Note (Signed)
Platelet count - wnl on last check.  Follow.

## 2018-08-20 NOTE — Assessment & Plan Note (Signed)
On protonix.  No actual reflux symptoms.  Follow.

## 2018-08-20 NOTE — Assessment & Plan Note (Signed)
Continued right side pain as outlined.  On protonix.  Still intermittent flares.  Will check abdominal ultrasound.  Further w/up pending results.  Pt in agreement.

## 2018-08-20 NOTE — Assessment & Plan Note (Signed)
Follow cbc.  

## 2018-08-20 NOTE — Assessment & Plan Note (Signed)
Blood pressure has been doing well.  Follow.   

## 2018-08-21 ENCOUNTER — Encounter: Payer: Self-pay | Admitting: Internal Medicine

## 2018-08-26 ENCOUNTER — Other Ambulatory Visit: Payer: Self-pay

## 2018-08-26 ENCOUNTER — Ambulatory Visit
Admission: RE | Admit: 2018-08-26 | Discharge: 2018-08-26 | Disposition: A | Discharge status: Home / Self Care | Payer: Medicare HMO | Source: Ambulatory Visit | Attending: Internal Medicine | Admitting: Internal Medicine

## 2018-08-26 DIAGNOSIS — R932 Abnormal findings on diagnostic imaging of liver and biliary tract: Secondary | ICD-10-CM | POA: Diagnosis not present

## 2018-08-26 DIAGNOSIS — R109 Unspecified abdominal pain: Secondary | ICD-10-CM | POA: Diagnosis not present

## 2018-08-27 ENCOUNTER — Other Ambulatory Visit: Payer: Self-pay | Admitting: Internal Medicine

## 2018-08-27 DIAGNOSIS — I10 Essential (primary) hypertension: Secondary | ICD-10-CM

## 2018-10-19 DIAGNOSIS — I10 Essential (primary) hypertension: Secondary | ICD-10-CM | POA: Diagnosis not present

## 2018-10-19 DIAGNOSIS — Z135 Encounter for screening for eye and ear disorders: Secondary | ICD-10-CM | POA: Diagnosis not present

## 2018-10-19 DIAGNOSIS — H524 Presbyopia: Secondary | ICD-10-CM | POA: Diagnosis not present

## 2018-12-09 ENCOUNTER — Other Ambulatory Visit: Payer: Self-pay

## 2018-12-13 ENCOUNTER — Ambulatory Visit (INDEPENDENT_AMBULATORY_CARE_PROVIDER_SITE_OTHER): Payer: Medicare HMO | Admitting: Internal Medicine

## 2018-12-13 ENCOUNTER — Encounter: Payer: Self-pay | Admitting: Internal Medicine

## 2018-12-13 ENCOUNTER — Other Ambulatory Visit: Payer: Self-pay

## 2018-12-13 ENCOUNTER — Other Ambulatory Visit: Payer: Self-pay | Admitting: Internal Medicine

## 2018-12-13 VITALS — BP 128/70 | HR 90 | Temp 95.9°F | Resp 16 | Ht 66.0 in | Wt 226.0 lb

## 2018-12-13 DIAGNOSIS — I10 Essential (primary) hypertension: Secondary | ICD-10-CM | POA: Diagnosis not present

## 2018-12-13 DIAGNOSIS — Z1231 Encounter for screening mammogram for malignant neoplasm of breast: Secondary | ICD-10-CM

## 2018-12-13 DIAGNOSIS — K219 Gastro-esophageal reflux disease without esophagitis: Secondary | ICD-10-CM

## 2018-12-13 DIAGNOSIS — Z0000 Encounter for general adult medical examination without abnormal findings: Secondary | ICD-10-CM | POA: Diagnosis not present

## 2018-12-13 DIAGNOSIS — D473 Essential (hemorrhagic) thrombocythemia: Secondary | ICD-10-CM

## 2018-12-13 DIAGNOSIS — D649 Anemia, unspecified: Secondary | ICD-10-CM | POA: Diagnosis not present

## 2018-12-13 DIAGNOSIS — D75839 Thrombocytosis, unspecified: Secondary | ICD-10-CM

## 2018-12-13 NOTE — Progress Notes (Signed)
Patient ID: Kylie Diaz, female   DOB: 18-Jul-1945, 73 y.o.   MRN: [id number]   Subjective:    Patient ID: Kylie Diaz, female    DOB: [birth year], 73 y.o.   MRN: [id number]  HPI  Patient here for her physical exam. She reports she is doing well.  Feels good.  Trying to stay active.  No chest pain.  No sob.  No acid reflux.  No abdominal pain.  Bowels moving.  Taking her blood pressure medication.  Reports blood pressure doing better.  Handling stress.  Stress is better.  Previous right side pain resolved.  No reflux.  Has adjusted her diet.  Better.      Past Medical History:  Diagnosis Date  . BMI 34.0-34.9,adult 04/11/2013  . Hypertension    Past Surgical History:  Procedure Laterality Date  . ABDOMINAL HYSTERECTOMY    . VAGINAL DELIVERY  2   Family History  Problem Relation Age of Onset  . Cancer Mother        unsure type  . Dementia Mother   . Aneurysm Sister   . Cancer Sister   . Cancer Brother        STOMACH  . Diabetes Brother   . Diabetes Sister   . Heart disease Sister    Social History   Socioeconomic History  . Marital status: Married    Spouse name: Not on file  . Number of children: Not on file  . Years of education: Not on file  . Highest education level: Not on file  Occupational History  . Not on file  Social Needs  . Financial resource strain: Not on file  . Food insecurity    Worry: Not on file    Inability: Not on file  . Transportation needs    Medical: Not on file    Non-medical: Not on file  Tobacco Use  . Smoking status: Former Smoker    Types: Cigarettes    Quit date: 11/03/1978    Years since quitting: 40.1  . Smokeless tobacco: Never Used  . Tobacco comment: 1980  Substance and Sexual Activity  . Alcohol use: Yes    Comment: social  . Drug use: No  . Sexual activity: Not Currently    Partners: Male    Birth control/protection: Surgical    Comment: hysterectomy.  Lifestyle  . Physical activity    Days per week: Not on  file    Minutes per session: Not on file  . Stress: Not on file  Relationships  . Social Herbalist on phone: Not on file    Gets together: Not on file    Attends religious service: Not on file    Active member of club or organization: Not on file    Attends meetings of clubs or organizations: Not on file    Relationship status: Not on file  Other Topics Concern  . Not on file  Social History Narrative   Lives in [place].      School - BA in [employer]      Work - Oceanographer    Outpatient Encounter Medications as of 12/13/2018  Medication Sig  . COLLAGEN PO Take by mouth.  . Calcium Carbonate-Vitamin D (CALCIUM-VITAMIN D) 500-200 MG-UNIT per tablet Take 2 tablets by mouth 3 (three) times daily with meals.  . hydrochlorothiazide (HYDRODIURIL) 25 MG tablet TAKE 1 TABLET EVERY DAY  . Omega-3 Fatty Acids (FISH OIL) 1000 MG CAPS  Take 1 capsule by mouth daily.  . pantoprazole (PROTONIX) 20 MG tablet Take 1 tablet (20 mg total) by mouth daily. Take 30 minutes before your evening meal  . Potassium (POTASSIMIN PO) Take by mouth as needed.  . vitamin E 100 UNIT capsule Take 100 Units by mouth daily.  . [DISCONTINUED] losartan (COZAAR) 25 MG tablet TAKE 1 TABLET EVERY DAY   No facility-administered encounter medications on file as of 12/13/2018.    Review of Systems  Constitutional: Negative for appetite change and unexpected weight change.  HENT: Negative for congestion and sinus pressure.   Eyes: Negative for pain and visual disturbance.  Respiratory: Negative for cough, chest tightness and shortness of breath.   Cardiovascular: Negative for chest pain, palpitations and leg swelling.  Gastrointestinal: Negative for abdominal pain, diarrhea, nausea and vomiting.  Genitourinary: Negative for difficulty urinating and dysuria.  Musculoskeletal: Negative for joint swelling and myalgias.  Skin: Negative for color change and rash.  Neurological: Negative for  dizziness, light-headedness and headaches.  Hematological: Negative for adenopathy. Does not bruise/bleed easily.  Psychiatric/Behavioral: Negative for agitation and dysphoric mood.       Objective:    Physical Exam Constitutional:      General: She is not in acute distress.    Appearance: Normal appearance. She is well-developed.  HENT:     Right Ear: External ear normal.     Left Ear: External ear normal.  Eyes:     General: No scleral icterus.       Right eye: No discharge.        Left eye: No discharge.     Conjunctiva/sclera: Conjunctivae normal.  Neck:     Musculoskeletal: Neck supple. No muscular tenderness.     Thyroid: No thyromegaly.  Cardiovascular:     Rate and Rhythm: Normal rate and regular rhythm.  Pulmonary:     Effort: No tachypnea, accessory muscle usage or respiratory distress.     Breath sounds: Normal breath sounds. No decreased breath sounds or wheezing.  Chest:     Breasts:        Right: No inverted nipple, mass, nipple discharge or tenderness (no axillary adenopathy).        Left: No inverted nipple, mass, nipple discharge or tenderness (no axilarry adenopathy).  Abdominal:     General: Bowel sounds are normal.     Palpations: Abdomen is soft.     Tenderness: There is no abdominal tenderness.  Musculoskeletal:        General: No swelling or tenderness.  Lymphadenopathy:     Cervical: No cervical adenopathy.  Skin:    Findings: No erythema or rash.  Neurological:     Mental Status: She is alert and oriented to person, place, and time.  Psychiatric:        Mood and Affect: Mood normal.        Behavior: Behavior normal.     BP 128/70   Pulse 90   Temp (!) 95.9 F (35.5 C)   Resp 16   Wt 226 lb (102.5 kg)   SpO2 97%   BMI 36.48 kg/m  Wt Readings from Last 3 Encounters:  12/13/18 226 lb (102.5 kg)  02/24/18 218 lb (98.9 kg)  10/22/17 219 lb 6.4 oz (99.5 kg)     Lab Results  Component Value Date   WBC 9.0 04/20/2018   HGB 11.1 (L)  04/20/2018   HCT 34.9 (L) 04/20/2018   PLT 366.0 04/20/2018   GLUCOSE 91 04/20/2018  CHOL 175 04/20/2018   TRIG 115.0 04/20/2018   HDL 48.60 04/20/2018   LDLCALC 104 (H) 04/20/2018   ALT 10 04/20/2018   AST 11 04/20/2018   NA 142 04/20/2018   K 4.1 04/20/2018   CL 106 04/20/2018   CREATININE 0.83 04/20/2018   BUN 10 04/20/2018   CO2 29 04/20/2018   TSH 0.73 10/26/2017   HGBA1C 5.4 04/20/2018   MICROALBUR 0.7 05/01/2015    US Abdomen Complete  Result Date: 08/26/2018 CLINICAL DATA:  Right-sided abdominal pain. EXAM: ABDOMEN ULTRASOUND COMPLETE COMPARISON:  None. FINDINGS: Gallbladder: No gallstones or wall thickening visualized. No sonographic Murphy sign noted by sonographer. Common bile duct: Diameter: 3 mm Liver: Heterogeneous increased liver parenchymal echogenicity. No mass or focal lesion. Portal vein is patent on color Doppler imaging with normal direction of blood flow towards the liver. IVC: No abnormality visualized. Pancreas: Visualized portion unremarkable. Spleen: Size and appearance within normal limits. Right Kidney: Length: 10.5 cm. Echogenicity within normal limits. No mass or hydronephrosis visualized. Left Kidney: Length: 10.6 cm. Echogenicity within normal limits. No mass or hydronephrosis visualized. Abdominal aorta: No aneurysm visualized. Other findings: None. IMPRESSION: 1. No acute findings.  Normal gallbladder.  No bile duct dilation. 2. Heterogeneous increased echogenicity of the liver parenchyma consistent with hepatic steatosis. No other abnormalities. Electronically Signed   By: Lajean Manes M.D.   On: 08/26/2018 13:58       Assessment & Plan:   Problem List Items Addressed This Visit    Anemia    Follow cbc.       Esophageal reflux    Controlled.  On protonix.        Healthcare maintenance    Physical today 12/13/18.  Mammogram 08/14/16 - Birads I.  Wanted to skip last year.  Schedule f/u mammogram.  cologuard - she has kit.  Will complete.   Discussed importance of colon cancer screening.       Hypertension    Blood pressure under good control.  Continue same medication regimen.  Follow pressures.  Follow metabolic panel.        Thrombocytosis (HCC)    Recheck cbc.         Other Visit Diagnoses    Encounter for screening mammogram for malignant neoplasm of breast    -  Primary   Relevant Orders   MM 3D SCREEN BREAST BILATERAL       Einar Pheasant, MD

## 2018-12-13 NOTE — Assessment & Plan Note (Addendum)
Physical today 12/13/18.  Mammogram 08/14/16 - [facility] I.  Wanted to skip last year.  Schedule f/u mammogram.  cologuard - she has kit.  Will complete.  Discussed importance of colon cancer screening.

## 2018-12-15 ENCOUNTER — Telehealth: Payer: Self-pay | Admitting: *Deleted

## 2018-12-15 DIAGNOSIS — D75839 Thrombocytosis, unspecified: Secondary | ICD-10-CM

## 2018-12-15 DIAGNOSIS — I10 Essential (primary) hypertension: Secondary | ICD-10-CM

## 2018-12-15 DIAGNOSIS — D649 Anemia, unspecified: Secondary | ICD-10-CM

## 2018-12-15 DIAGNOSIS — R739 Hyperglycemia, unspecified: Secondary | ICD-10-CM

## 2018-12-15 DIAGNOSIS — D473 Essential (hemorrhagic) thrombocythemia: Secondary | ICD-10-CM

## 2018-12-15 NOTE — Telephone Encounter (Signed)
Please place future orders for lab appt.  

## 2018-12-15 NOTE — Telephone Encounter (Signed)
Orders placed for labs

## 2018-12-18 ENCOUNTER — Encounter: Payer: Self-pay | Admitting: Internal Medicine

## 2018-12-18 NOTE — Assessment & Plan Note (Signed)
Controlled.  On protonix.   

## 2018-12-18 NOTE — Assessment & Plan Note (Signed)
Follow cbc.  

## 2018-12-18 NOTE — Assessment & Plan Note (Signed)
Blood pressure under good control.  Continue same medication regimen.  Follow pressures.  Follow metabolic panel.   

## 2018-12-18 NOTE — Assessment & Plan Note (Signed)
Recheck cbc.  

## 2018-12-20 ENCOUNTER — Other Ambulatory Visit (INDEPENDENT_AMBULATORY_CARE_PROVIDER_SITE_OTHER): Payer: Medicare HMO

## 2018-12-20 ENCOUNTER — Other Ambulatory Visit: Payer: Self-pay

## 2018-12-20 DIAGNOSIS — I10 Essential (primary) hypertension: Secondary | ICD-10-CM | POA: Diagnosis not present

## 2018-12-20 DIAGNOSIS — D649 Anemia, unspecified: Secondary | ICD-10-CM

## 2018-12-20 DIAGNOSIS — H52209 Unspecified astigmatism, unspecified eye: Secondary | ICD-10-CM | POA: Diagnosis not present

## 2018-12-20 DIAGNOSIS — H524 Presbyopia: Secondary | ICD-10-CM | POA: Diagnosis not present

## 2018-12-20 DIAGNOSIS — R739 Hyperglycemia, unspecified: Secondary | ICD-10-CM

## 2018-12-20 DIAGNOSIS — H5203 Hypermetropia, bilateral: Secondary | ICD-10-CM | POA: Diagnosis not present

## 2018-12-20 LAB — BASIC METABOLIC PANEL
BUN: 8 mg/dL (ref 6–23)
CO2: 30 mEq/L (ref 19–32)
Calcium: 9.8 mg/dL (ref 8.4–10.5)
Chloride: 105 mEq/L (ref 96–112)
Creatinine, Ser: 0.74 mg/dL (ref 0.40–1.20)
GFR: 93.28 mL/min (ref >60.00)
Glucose, Bld: 82 mg/dL (ref 70–99)
Potassium: 3.5 mEq/L (ref 3.5–5.1)
Sodium: 141 mEq/L (ref 135–145)

## 2018-12-20 LAB — HEPATIC FUNCTION PANEL
ALT: 9 U/L (ref 0–35)
AST: 13 U/L (ref 0–37)
Albumin: 4.1 g/dL (ref 3.5–5.2)
Alkaline Phosphatase: 54 U/L (ref 39–117)
Bilirubin, Direct: 0.1 mg/dL (ref 0.0–0.3)
Total Bilirubin: 0.6 mg/dL (ref 0.2–1.2)
Total Protein: 6.9 g/dL (ref 6.0–8.3)

## 2018-12-20 LAB — LIPID PANEL
Cholesterol: 166 mg/dL (ref 0–200)
HDL: 51.5 mg/dL (ref >39.00)
LDL Cholesterol: 91 mg/dL (ref 0–99)
NonHDL: 114.08
Total CHOL/HDL Ratio: 3
Triglycerides: 117 mg/dL (ref 0.0–149.0)
VLDL: 23.4 mg/dL (ref 0.0–40.0)

## 2018-12-20 LAB — CBC WITH DIFFERENTIAL/PLATELET
Basophils Absolute: 0 10*3/uL (ref 0.0–0.1)
Basophils Relative: 0.4 % (ref 0.0–3.0)
Eosinophils Absolute: 0.1 10*3/uL (ref 0.0–0.7)
Eosinophils Relative: 0.9 % (ref 0.0–5.0)
HCT: 35.1 % — ABNORMAL LOW (ref 36.0–46.0)
Hemoglobin: 11.1 g/dL — ABNORMAL LOW (ref 12.0–15.0)
Lymphocytes Relative: 23.1 % (ref 12.0–46.0)
Lymphs Abs: 2 10*3/uL (ref 0.7–4.0)
MCHC: 31.6 g/dL (ref 30.0–36.0)
MCV: 87.9 fl (ref 78.0–100.0)
Monocytes Absolute: 0.6 10*3/uL (ref 0.1–1.0)
Monocytes Relative: 6.7 % (ref 3.0–12.0)
Neutro Abs: 5.9 10*3/uL (ref 1.4–7.7)
Neutrophils Relative %: 68.9 % (ref 43.0–77.0)
Platelets: 398 10*3/uL (ref 150.0–400.0)
RBC: 4 Mil/uL (ref 3.87–5.11)
RDW: 14.1 % (ref 11.5–15.5)
WBC: 8.5 10*3/uL (ref 4.0–10.5)

## 2018-12-20 LAB — FERRITIN: Ferritin: 210.3 ng/mL (ref 10.0–291.0)

## 2018-12-20 LAB — TSH: TSH: 0.85 u[IU]/mL (ref 0.35–4.50)

## 2018-12-20 LAB — HEMOGLOBIN A1C: Hgb A1c MFr Bld: 5.6 % (ref 4.6–6.5)

## 2018-12-22 ENCOUNTER — Other Ambulatory Visit: Payer: Self-pay | Admitting: Internal Medicine

## 2018-12-22 DIAGNOSIS — L989 Disorder of the skin and subcutaneous tissue, unspecified: Secondary | ICD-10-CM

## 2018-12-22 NOTE — Progress Notes (Signed)
Order placed for referral to Dr Phillip Heal.

## 2018-12-27 ENCOUNTER — Other Ambulatory Visit: Payer: Self-pay | Admitting: Internal Medicine

## 2018-12-27 DIAGNOSIS — D649 Anemia, unspecified: Secondary | ICD-10-CM

## 2018-12-27 NOTE — Progress Notes (Signed)
Order placed for GI referral.   

## 2019-01-04 ENCOUNTER — Encounter: Payer: Self-pay | Admitting: Internal Medicine

## 2019-01-05 DIAGNOSIS — L821 Other seborrheic keratosis: Secondary | ICD-10-CM | POA: Diagnosis not present

## 2019-01-05 DIAGNOSIS — D2262 Melanocytic nevi of left upper limb, including shoulder: Secondary | ICD-10-CM | POA: Diagnosis not present

## 2019-01-05 DIAGNOSIS — L918 Other hypertrophic disorders of the skin: Secondary | ICD-10-CM | POA: Diagnosis not present

## 2019-01-20 ENCOUNTER — Other Ambulatory Visit: Payer: Self-pay | Admitting: Internal Medicine

## 2019-01-20 DIAGNOSIS — I10 Essential (primary) hypertension: Secondary | ICD-10-CM

## 2019-04-17 ENCOUNTER — Encounter: Payer: Self-pay | Admitting: Internal Medicine

## 2019-04-21 ENCOUNTER — Ambulatory Visit (INDEPENDENT_AMBULATORY_CARE_PROVIDER_SITE_OTHER): Payer: Medicare HMO | Admitting: Internal Medicine

## 2019-04-21 ENCOUNTER — Other Ambulatory Visit: Payer: Self-pay

## 2019-04-21 VITALS — Ht 66.0 in | Wt 226.0 lb

## 2019-04-21 DIAGNOSIS — K219 Gastro-esophageal reflux disease without esophagitis: Secondary | ICD-10-CM

## 2019-04-21 DIAGNOSIS — D75839 Thrombocytosis, unspecified: Secondary | ICD-10-CM

## 2019-04-21 DIAGNOSIS — D473 Essential (hemorrhagic) thrombocythemia: Secondary | ICD-10-CM

## 2019-04-21 DIAGNOSIS — I10 Essential (primary) hypertension: Secondary | ICD-10-CM

## 2019-04-21 DIAGNOSIS — H5789 Other specified disorders of eye and adnexa: Secondary | ICD-10-CM

## 2019-04-21 DIAGNOSIS — D649 Anemia, unspecified: Secondary | ICD-10-CM

## 2019-04-21 NOTE — Progress Notes (Signed)
Patient ID: Kylie Diaz, female   DOB: [birth year], 74 y.o.   MRN: [id number]   Virtual Visit via telephone Note  This visit type was conducted due to national recommendations for restrictions regarding the COVID-19 pandemic (e.g. social distancing).  This format is felt to be most appropriate for this patient at this time.  All issues noted in this document were discussed and addressed.  No physical exam was performed (except for noted visual exam findings with Video Visits).   I connected with [name] by telephone and verified that I am speaking with the correct person using two identifiers. Location patient: home Location provider: work Persons participating in the telephone visit: patient, provider  The limitations, risks, security and privacy concerns of performing an evaluation and management service by telephone and the availability of in person appointments have been discussed. The patient expressed understanding and agreed to proceed.   Reason for visit: scheduled follow up.   HPI: She states she is doing relatively well.  Blood pressure doing well.  No swelling in her ankles.  No chest pain or sob reported.  No acid reflux, abdominal pain or bowel change reported.  Her left eye has been draining.  Persistent. Plans to f/u with [facility] ophthalmology.  Wants to hold on mammogram due to covid.  Will notify me when agreeable.     ROS: See pertinent positives and negatives per HPI.  Past Medical History:  Diagnosis Date  . BMI 34.0-34.9,adult 04/11/2013  . Hypertension     Past Surgical History:  Procedure Laterality Date  . ABDOMINAL HYSTERECTOMY    . VAGINAL DELIVERY  2    Family History  Problem Relation Age of Onset  . Cancer Mother        unsure type  . Dementia Mother   . Aneurysm Sister   . Cancer Sister   . Cancer Brother        STOMACH  . Diabetes Brother   . Diabetes Sister   . Heart disease Sister     SOCIAL HX: reviewed.    Current  Outpatient Medications:  .  Calcium Carbonate-Vitamin D (CALCIUM-VITAMIN D) 500-200 MG-UNIT per tablet, Take 2 tablets by mouth 3 (three) times daily with meals., Disp: , Rfl:  .  COLLAGEN PO, Take by mouth., Disp: , Rfl:  .  hydrochlorothiazide (HYDRODIURIL) 25 MG tablet, TAKE 1 TABLET EVERY DAY, Disp: 90 tablet, Rfl: 1 .  losartan (COZAAR) 25 MG tablet, TAKE 1 TABLET EVERY DAY, Disp: 90 tablet, Rfl: 1 .  Omega-3 Fatty Acids (FISH OIL) 1000 MG CAPS, Take 1 capsule by mouth daily., Disp: , Rfl:  .  pantoprazole (PROTONIX) 20 MG tablet, Take 1 tablet (20 mg total) by mouth daily. Take 30 minutes before your evening meal, Disp: 30 tablet, Rfl: 1 .  Potassium (POTASSIMIN PO), Take by mouth as needed., Disp: , Rfl:  .  vitamin E 100 UNIT capsule, Take 100 Units by mouth daily., Disp: , Rfl:   EXAM:  VITALS per patient if applicable: [id number]  GENERAL: alert.  Sounds to be in no acute distress. Answering questions appropriately.   PSYCH/NEURO: pleasant and cooperative, no obvious depression or anxiety, speech and thought processing grossly intact  ASSESSMENT AND PLAN:  Discussed the following assessment and plan:  Anemia Follow cbc.   Esophageal reflux On protonix.  Controlled.   Hypertension Blood pressure doing well on current medication regimen.  Follow pressures.  Follow metabolic panel.   Thrombocytosis (HCC) Last platelet count  wnl.  Follow cbc.   Eye drainage Left eye drainage - persistent.  Has f/u planned with ophthalmology.     Orders Placed This Encounter  Procedures  . CBC with Differential/Platelet    Standing Status:   Future    Standing Expiration Date:   04/20/2020  . Hemoglobin A1c    Standing Status:   Future    Standing Expiration Date:   04/20/2020  . Hepatic function panel    Standing Status:   Future    Standing Expiration Date:   04/20/2020  . Lipid panel    Standing Status:   Future    Standing Expiration Date:   04/20/2020  . Basic metabolic panel     Standing Status:   Future    Standing Expiration Date:   04/20/2020  . IBC + Ferritin    Standing Status:   Future    Standing Expiration Date:   04/20/2020     I discussed the assessment and treatment plan with the patient. The patient was provided an opportunity to ask questions and all were answered. The patient agreed with the plan and demonstrated an understanding of the instructions.   The patient was advised to call back or seek an in-person evaluation if the symptoms worsen or if the condition fails to improve as anticipated.  I provided 21 minutes of non-face-to-face time during this encounter.   Einar Pheasant, MD

## 2019-04-24 ENCOUNTER — Ambulatory Visit: Payer: Medicare HMO | Attending: Internal Medicine

## 2019-04-24 DIAGNOSIS — Z20822 Contact with and (suspected) exposure to covid-19: Secondary | ICD-10-CM

## 2019-04-24 DIAGNOSIS — Z029 Encounter for administrative examinations, unspecified: Secondary | ICD-10-CM | POA: Insufficient documentation

## 2019-04-25 ENCOUNTER — Other Ambulatory Visit: Payer: Self-pay | Admitting: Internal Medicine

## 2019-04-28 ENCOUNTER — Ambulatory Visit: Payer: Medicare HMO | Attending: Internal Medicine

## 2019-04-28 DIAGNOSIS — Z23 Encounter for immunization: Secondary | ICD-10-CM | POA: Insufficient documentation

## 2019-04-28 NOTE — Progress Notes (Signed)
   Covid-19 Vaccination Clinic  Name:  BANEEN DUA    MRN: [id number] DOB: [birth year]  04/28/2019  Ms. Foody was observed post Covid-19 immunization for 15 minutes without incidence. She was provided with Vaccine Information Sheet and instruction to access the V-Safe system.   Ms. Ferrell was instructed to call 911 with any severe reactions post vaccine: Marland Kitchen Difficulty breathing  . Swelling of your face and throat  . A fast heartbeat  . A bad rash all over your body  . Dizziness and weakness    Immunizations Administered    Name Date Dose VIS Date Route   Pfizer COVID-19 Vaccine 04/28/2019 12:09 PM 0.3 mL 02/24/2019 Intramuscular   Manufacturer: [place]   Lot: [id number]   [place]: [id number]

## 2019-04-29 ENCOUNTER — Encounter: Payer: Self-pay | Admitting: Internal Medicine

## 2019-04-29 DIAGNOSIS — H5789 Other specified disorders of eye and adnexa: Secondary | ICD-10-CM | POA: Insufficient documentation

## 2019-04-29 NOTE — Assessment & Plan Note (Signed)
On protonix.  Controlled.   

## 2019-04-29 NOTE — Assessment & Plan Note (Signed)
Left eye drainage - persistent.  Has f/u planned with ophthalmology.

## 2019-04-29 NOTE — Assessment & Plan Note (Signed)
Follow cbc.  

## 2019-04-29 NOTE — Assessment & Plan Note (Signed)
Blood pressure doing well on current medication regimen.  Follow pressures.  Follow metabolic panel.

## 2019-04-29 NOTE — Assessment & Plan Note (Signed)
Last platelet count wnl.  Follow cbc.

## 2019-05-12 ENCOUNTER — Other Ambulatory Visit: Payer: Self-pay

## 2019-05-12 ENCOUNTER — Other Ambulatory Visit (INDEPENDENT_AMBULATORY_CARE_PROVIDER_SITE_OTHER): Payer: Medicare HMO

## 2019-05-12 DIAGNOSIS — I10 Essential (primary) hypertension: Secondary | ICD-10-CM | POA: Diagnosis not present

## 2019-05-12 DIAGNOSIS — D649 Anemia, unspecified: Secondary | ICD-10-CM | POA: Diagnosis not present

## 2019-05-12 LAB — CBC WITH DIFFERENTIAL/PLATELET
Basophils Absolute: 0 10*3/uL (ref 0.0–0.1)
Basophils Relative: 0.6 % (ref 0.0–3.0)
Eosinophils Absolute: 0.2 10*3/uL (ref 0.0–0.7)
Eosinophils Relative: 2.2 % (ref 0.0–5.0)
HCT: 34.6 % — ABNORMAL LOW (ref 36.0–46.0)
Hemoglobin: 11.1 g/dL — ABNORMAL LOW (ref 12.0–15.0)
Lymphocytes Relative: 23.8 % (ref 12.0–46.0)
Lymphs Abs: 1.7 10*3/uL (ref 0.7–4.0)
MCHC: 32 g/dL (ref 30.0–36.0)
MCV: 87.3 fl (ref 78.0–100.0)
Monocytes Absolute: 0.6 10*3/uL (ref 0.1–1.0)
Monocytes Relative: 8.5 % (ref 3.0–12.0)
Neutro Abs: 4.6 10*3/uL (ref 1.4–7.7)
Neutrophils Relative %: 64.9 % (ref 43.0–77.0)
Platelets: 376 10*3/uL (ref 150.0–400.0)
RBC: 3.96 Mil/uL (ref 3.87–5.11)
RDW: 14.1 % (ref 11.5–15.5)
WBC: 7 10*3/uL (ref 4.0–10.5)

## 2019-05-12 LAB — LIPID PANEL
Cholesterol: 160 mg/dL (ref 0–200)
HDL: 47.1 mg/dL (ref >39.00)
LDL Cholesterol: 93 mg/dL (ref 0–99)
NonHDL: 112.71
Total CHOL/HDL Ratio: 3
Triglycerides: 98 mg/dL (ref 0.0–149.0)
VLDL: 19.6 mg/dL (ref 0.0–40.0)

## 2019-05-12 LAB — BASIC METABOLIC PANEL
BUN: 13 mg/dL (ref 6–23)
CO2: 32 mEq/L (ref 19–32)
Calcium: 9.9 mg/dL (ref 8.4–10.5)
Chloride: 104 mEq/L (ref 96–112)
Creatinine, Ser: 0.75 mg/dL (ref 0.40–1.20)
GFR: 91.75 mL/min (ref >60.00)
Glucose, Bld: 104 mg/dL — ABNORMAL HIGH (ref 70–99)
Potassium: 3.2 mEq/L — ABNORMAL LOW (ref 3.5–5.1)
Sodium: 141 mEq/L (ref 135–145)

## 2019-05-12 LAB — HEPATIC FUNCTION PANEL
ALT: 9 U/L (ref 0–35)
AST: 12 U/L (ref 0–37)
Albumin: 3.9 g/dL (ref 3.5–5.2)
Alkaline Phosphatase: 53 U/L (ref 39–117)
Bilirubin, Direct: 0.1 mg/dL (ref 0.0–0.3)
Total Bilirubin: 0.5 mg/dL (ref 0.2–1.2)
Total Protein: 7.1 g/dL (ref 6.0–8.3)

## 2019-05-12 LAB — IBC + FERRITIN
Ferritin: 259.8 ng/mL (ref 10.0–291.0)
Iron: 67 ug/dL (ref 42–145)
Saturation Ratios: 21.9 % (ref 20.0–50.0)
Transferrin: 219 mg/dL (ref 212.0–360.0)

## 2019-05-12 LAB — HEMOGLOBIN A1C: Hgb A1c MFr Bld: 5.3 % (ref 4.6–6.5)

## 2019-05-13 ENCOUNTER — Other Ambulatory Visit: Payer: Self-pay | Admitting: Internal Medicine

## 2019-05-13 ENCOUNTER — Encounter: Payer: Self-pay | Admitting: Internal Medicine

## 2019-05-13 DIAGNOSIS — E876 Hypokalemia: Secondary | ICD-10-CM

## 2019-05-13 NOTE — Progress Notes (Signed)
Order placed for f/u potassium check.  

## 2019-05-15 NOTE — Telephone Encounter (Signed)
See result note.  

## 2019-05-16 ENCOUNTER — Other Ambulatory Visit: Payer: Self-pay

## 2019-05-16 MED ORDER — POTASSIUM CHLORIDE ER 10 MEQ PO TBCR: 10.0000 meq | EXTENDED_RELEASE_TABLET | Freq: Every day | ORAL | 0 refills | Status: DC

## 2019-05-17 ENCOUNTER — Other Ambulatory Visit: Payer: Self-pay | Admitting: Internal Medicine

## 2019-05-17 DIAGNOSIS — D649 Anemia, unspecified: Secondary | ICD-10-CM

## 2019-05-17 DIAGNOSIS — Z1211 Encounter for screening for malignant neoplasm of colon: Secondary | ICD-10-CM

## 2019-05-17 NOTE — Progress Notes (Signed)
Order placed for GI referral.   

## 2019-05-18 ENCOUNTER — Encounter: Payer: Self-pay | Admitting: Internal Medicine

## 2019-05-22 ENCOUNTER — Encounter: Payer: Self-pay | Admitting: Internal Medicine

## 2019-05-23 ENCOUNTER — Ambulatory Visit: Payer: Self-pay | Attending: Internal Medicine

## 2019-05-23 DIAGNOSIS — Z23 Encounter for immunization: Secondary | ICD-10-CM | POA: Insufficient documentation

## 2019-05-23 NOTE — Progress Notes (Signed)
   Covid-19 Vaccination Clinic  Name:  Kylie Diaz    MRN: [id number] DOB: November 06, 1945  05/23/2019  [name] was observed post Covid-19 immunization for 15 minutes without incident. She was provided with Vaccine Information Sheet and instruction to access the V-Safe system.   [name] was instructed to call 911 with any severe reactions post vaccine: Marland Kitchen Difficulty breathing  . Swelling of face and throat  . A fast heartbeat  . A bad rash all over body  . Dizziness and weakness   Immunizations Administered    Name Date Dose VIS Date Route   Pfizer COVID-19 Vaccine 05/23/2019 12:52 PM 0.3 mL 02/24/2019 Intramuscular   Manufacturer: [place]   Lot: [id number]   [place]: [id number]

## 2019-05-26 ENCOUNTER — Other Ambulatory Visit (INDEPENDENT_AMBULATORY_CARE_PROVIDER_SITE_OTHER): Payer: Medicare HMO

## 2019-05-26 ENCOUNTER — Other Ambulatory Visit: Payer: Self-pay

## 2019-05-26 DIAGNOSIS — E876 Hypokalemia: Secondary | ICD-10-CM

## 2019-05-26 LAB — POTASSIUM: Potassium: 4.2 mEq/L (ref 3.5–5.1)

## 2019-05-29 ENCOUNTER — Other Ambulatory Visit: Payer: Self-pay | Admitting: Internal Medicine

## 2019-05-29 DIAGNOSIS — E876 Hypokalemia: Secondary | ICD-10-CM

## 2019-05-29 NOTE — Progress Notes (Signed)
Order placed for f/u lab.   

## 2019-06-07 ENCOUNTER — Other Ambulatory Visit: Payer: Self-pay | Admitting: Internal Medicine

## 2019-06-07 DIAGNOSIS — I10 Essential (primary) hypertension: Secondary | ICD-10-CM

## 2019-06-12 DIAGNOSIS — H04123 Dry eye syndrome of bilateral lacrimal glands: Secondary | ICD-10-CM | POA: Diagnosis not present

## 2019-06-12 DIAGNOSIS — H0288B Meibomian gland dysfunction left eye, upper and lower eyelids: Secondary | ICD-10-CM | POA: Diagnosis not present

## 2019-06-12 DIAGNOSIS — H0288A Meibomian gland dysfunction right eye, upper and lower eyelids: Secondary | ICD-10-CM | POA: Diagnosis not present

## 2019-06-12 DIAGNOSIS — H04222 Epiphora due to insufficient drainage, left lacrimal gland: Secondary | ICD-10-CM | POA: Diagnosis not present

## 2019-06-13 ENCOUNTER — Other Ambulatory Visit: Payer: Self-pay | Admitting: Internal Medicine

## 2019-06-14 NOTE — Telephone Encounter (Signed)
Refill request for Potassium, last seen 05-29-19, last filled 05-16-19.  Please advise. Last potassium result- 4.2 on 05-26-19

## 2019-06-15 NOTE — Telephone Encounter (Signed)
rx sent in for potassium refill #90 with no refills.

## 2019-06-20 ENCOUNTER — Telehealth: Payer: Self-pay

## 2019-06-20 ENCOUNTER — Other Ambulatory Visit: Payer: Self-pay

## 2019-06-20 ENCOUNTER — Ambulatory Visit (INDEPENDENT_AMBULATORY_CARE_PROVIDER_SITE_OTHER): Payer: Medicare HMO | Admitting: Internal Medicine

## 2019-06-20 ENCOUNTER — Ambulatory Visit: Payer: Medicare HMO

## 2019-06-20 ENCOUNTER — Encounter: Payer: Self-pay | Admitting: Internal Medicine

## 2019-06-20 DIAGNOSIS — D473 Essential (hemorrhagic) thrombocythemia: Secondary | ICD-10-CM | POA: Diagnosis not present

## 2019-06-20 DIAGNOSIS — I10 Essential (primary) hypertension: Secondary | ICD-10-CM | POA: Diagnosis not present

## 2019-06-20 DIAGNOSIS — D649 Anemia, unspecified: Secondary | ICD-10-CM | POA: Diagnosis not present

## 2019-06-20 DIAGNOSIS — D75839 Thrombocytosis, unspecified: Secondary | ICD-10-CM

## 2019-06-20 DIAGNOSIS — E876 Hypokalemia: Secondary | ICD-10-CM | POA: Diagnosis not present

## 2019-06-20 LAB — POTASSIUM: Potassium: 4.4 mEq/L (ref 3.5–5.1)

## 2019-06-20 NOTE — Progress Notes (Signed)
Patient ID: Kylie Diaz, female   DOB: 24-Apr-1945, 74 y.o.   MRN: [id number]   Subjective:    Patient ID: Kylie Diaz, female    DOB: May 24, 1945, 74 y.o.   MRN: [id number]  HPI  Patient here for scheduled follow up.  She reports she has been doing relatively well.  Had some questions and concerns about her low potassium.  We discussed possible etiologies.  Discussed probably from the hctz - now that she is taking regularly.  She is on a potassium supplement now.  Last potassium check wnl.  Recheck today to confirm remaining wnl.  No chest pain or sob reported.  No abdominal pain or bowel change reported.  Blood pressure has been doing better.    Past Medical History:  Diagnosis Date   BMI 34.0-34.9,adult 04/11/2013   Hypertension    Past Surgical History:  Procedure Laterality Date   ABDOMINAL HYSTERECTOMY     VAGINAL DELIVERY  2   Family History  Problem Relation Age of Onset   Cancer Mother        unsure type   Dementia Mother    Aneurysm Sister    Cancer Sister    Cancer Brother        STOMACH   Diabetes Brother    Diabetes Sister    Heart disease Sister    Social History   Socioeconomic History   Marital status: Married    Spouse name: Not on file   Number of children: Not on file   Years of education: Not on file   Highest education level: Not on file  Occupational History   Not on file  Tobacco Use   Smoking status: Former Smoker    Types: Cigarettes    Quit date: 11/03/1978    Years since quitting: 40.6   Smokeless tobacco: Never Used   Tobacco comment: 1980  Substance and Sexual Activity   Alcohol use: Yes    Comment: social   Drug use: No   Sexual activity: Not Currently    Partners: Male    Birth control/protection: Surgical    Comment: hysterectomy.  Other Topics Concern   Not on file  Social History Narrative   Lives in [place].      School - BA in [employer]      Work - Oceanographer   Social  Determinants of Radio broadcast assistant Strain:    Difficulty of Paying Living Expenses:   Food Insecurity:    Worried About Charity fundraiser in the Last Year:    Arboriculturist in the Last Year:   Transportation Needs:    Film/video editor (Medical):    Lack of Transportation (Non-Medical):   Physical Activity:    Days of Exercise per Week:    Minutes of Exercise per Session:   Stress:    Feeling of Stress :   Social Connections:    Frequency of Communication with Friends and Family:    Frequency of Social Gatherings with Friends and Family:    Attends Religious Services:    Active Member of Clubs or Organizations:    Attends Archivist Meetings:    Marital Status:     Outpatient Encounter Medications as of 06/20/2019  Medication Sig   Calcium Carbonate-Vitamin D (CALCIUM-VITAMIN D) 500-200 MG-UNIT per tablet Take 2 tablets by mouth 3 (three) times daily with meals.   COLLAGEN PO Take by mouth.   hydrochlorothiazide (HYDRODIURIL)  25 MG tablet TAKE 1 TABLET EVERY DAY   losartan (COZAAR) 25 MG tablet TAKE 1 TABLET EVERY DAY   Omega-3 Fatty Acids (FISH OIL) 1000 MG CAPS Take 1 capsule by mouth daily.   pantoprazole (PROTONIX) 20 MG tablet Take 1 tablet (20 mg total) by mouth daily. Take 30 minutes before your evening meal   potassium chloride (KLOR-CON) 10 MEQ tablet TAKE 1 TABLET(10 MEQ) BY MOUTH DAILY   vitamin E 100 UNIT capsule Take 100 Units by mouth daily.   No facility-administered encounter medications on file as of 06/20/2019.   Review of Systems  Constitutional: Negative for appetite change and unexpected weight change.  HENT: Negative for congestion and sinus pressure.   Respiratory: Negative for cough, chest tightness and shortness of breath.   Cardiovascular: Negative for chest pain, palpitations and leg swelling.  Gastrointestinal: Negative for abdominal pain, diarrhea, nausea and vomiting.  Genitourinary: Negative for  difficulty urinating and dysuria.  Musculoskeletal: Negative for joint swelling and myalgias.  Skin: Negative for color change and rash.  Neurological: Negative for dizziness, light-headedness and headaches.  Psychiatric/Behavioral: Negative for agitation and dysphoric mood.       Objective:    Physical Exam Constitutional:      General: She is not in acute distress.    Appearance: Normal appearance.  HENT:     Head: Normocephalic and atraumatic.     Right Ear: External ear normal.     Left Ear: External ear normal.  Eyes:     General: No scleral icterus.       Right eye: No discharge.        Left eye: No discharge.     Conjunctiva/sclera: Conjunctivae normal.  Neck:     Thyroid: No thyromegaly.  Cardiovascular:     Rate and Rhythm: Normal rate and regular rhythm.  Pulmonary:     Effort: No respiratory distress.     Breath sounds: Normal breath sounds. No wheezing.  Abdominal:     General: Bowel sounds are normal.     Palpations: Abdomen is soft.     Tenderness: There is no abdominal tenderness.  Musculoskeletal:        General: No swelling or tenderness.     Cervical back: Neck supple. No tenderness.  Lymphadenopathy:     Cervical: No cervical adenopathy.  Skin:    Findings: No erythema or rash.  Neurological:     Mental Status: She is alert.  Psychiatric:        Mood and Affect: Mood normal.        Behavior: Behavior normal.     BP 130/72    Pulse 96    Temp (!) 95.7 F (35.4 C)    Resp 16    Ht 5\' 6"  (1.676 m)    Wt 228 lb (103.4 kg)    SpO2 98%    BMI 36.80 kg/m  Wt Readings from Last 3 Encounters:  06/20/19 228 lb (103.4 kg)  04/21/19 226 lb (102.5 kg)  12/13/18 226 lb (102.5 kg)     Lab Results  Component Value Date   WBC 7.0 05/12/2019   HGB 11.1 (L) 05/12/2019   HCT 34.6 (L) 05/12/2019   PLT 376.0 05/12/2019   GLUCOSE 104 (H) 05/12/2019   CHOL 160 05/12/2019   TRIG 98.0 05/12/2019   HDL 47.10 05/12/2019   LDLCALC 93 05/12/2019   ALT 9  05/12/2019   AST 12 05/12/2019   NA 141 05/12/2019   K 4.4 06/20/2019  CL 104 05/12/2019   CREATININE 0.75 05/12/2019   BUN 13 05/12/2019   CO2 32 05/12/2019   TSH 0.85 12/20/2018   HGBA1C 5.3 05/12/2019   MICROALBUR 0.7 05/01/2015    US Abdomen Complete  Result Date: 08/26/2018 CLINICAL DATA:  Right-sided abdominal pain. EXAM: ABDOMEN ULTRASOUND COMPLETE COMPARISON:  None. FINDINGS: Gallbladder: No gallstones or wall thickening visualized. No sonographic Murphy sign noted by sonographer. Common bile duct: Diameter: 3 mm Liver: Heterogeneous increased liver parenchymal echogenicity. No mass or focal lesion. Portal vein is patent on color Doppler imaging with normal direction of blood flow towards the liver. IVC: No abnormality visualized. Pancreas: Visualized portion unremarkable. Spleen: Size and appearance within normal limits. Right Kidney: Length: 10.5 cm. Echogenicity within normal limits. No mass or hydronephrosis visualized. Left Kidney: Length: 10.6 cm. Echogenicity within normal limits. No mass or hydronephrosis visualized. Abdominal aorta: No aneurysm visualized. Other findings: None. IMPRESSION: 1. No acute findings.  Normal gallbladder.  No bile duct dilation. 2. Heterogeneous increased echogenicity of the liver parenchyma consistent with hepatic steatosis. No other abnormalities. Electronically Signed   By: Lajean Manes M.D.   On: 08/26/2018 13:58       Assessment & Plan:   Problem List Items Addressed This Visit    Anemia    Follow cbc.       Hypertension    Blood pressure doing well on current regimen.  Follow pressures.  Follow metabolic panel.        Hypokalemia    Discussed low potassium and possible etiologies.  Discussed probably related to taking hctz on a regular basis now.  On kcl 48meq q day.  Last potassium wnl.  Recheck today to confirm stable.        Thrombocytosis ([place])    05/12/19 platelet count wnl.  Follow.            Einar Pheasant, MD

## 2019-06-20 NOTE — Telephone Encounter (Signed)
Failed to reach patient for scheduled annual wellness via telephone. No answer when preferred number called. Left a message to call the office back within allotted timeframe or reschedule. Please reschedule as appropriate.

## 2019-06-25 ENCOUNTER — Encounter: Payer: Self-pay | Admitting: Internal Medicine

## 2019-06-25 DIAGNOSIS — E876 Hypokalemia: Secondary | ICD-10-CM | POA: Insufficient documentation

## 2019-06-25 NOTE — Assessment & Plan Note (Signed)
Blood pressure doing well on current regimen.  Follow pressures.  Follow metabolic panel.   

## 2019-06-25 NOTE — Assessment & Plan Note (Signed)
Follow cbc.  

## 2019-06-25 NOTE — Assessment & Plan Note (Signed)
05/12/19 platelet count wnl.  Follow.

## 2019-06-25 NOTE — Assessment & Plan Note (Signed)
Discussed low potassium and possible etiologies.  Discussed probably related to taking hctz on a regular basis now.  On kcl 29meq q day.  Last potassium wnl.  Recheck today to confirm stable.

## 2019-06-29 DIAGNOSIS — E876 Hypokalemia: Secondary | ICD-10-CM | POA: Diagnosis not present

## 2019-06-29 DIAGNOSIS — E669 Obesity, unspecified: Secondary | ICD-10-CM | POA: Diagnosis not present

## 2019-06-29 DIAGNOSIS — Z01812 Encounter for preprocedural laboratory examination: Secondary | ICD-10-CM | POA: Diagnosis not present

## 2019-06-29 DIAGNOSIS — Z1211 Encounter for screening for malignant neoplasm of colon: Secondary | ICD-10-CM | POA: Diagnosis not present

## 2019-06-29 DIAGNOSIS — Z862 Personal history of diseases of the blood and blood-forming organs and certain disorders involving the immune mechanism: Secondary | ICD-10-CM | POA: Diagnosis not present

## 2019-06-29 DIAGNOSIS — I10 Essential (primary) hypertension: Secondary | ICD-10-CM | POA: Diagnosis not present

## 2019-07-12 DIAGNOSIS — H04222 Epiphora due to insufficient drainage, left lacrimal gland: Secondary | ICD-10-CM | POA: Diagnosis not present

## 2019-08-04 ENCOUNTER — Ambulatory Visit (INDEPENDENT_AMBULATORY_CARE_PROVIDER_SITE_OTHER): Payer: Medicare HMO

## 2019-08-04 VITALS — BP 104/62 | Ht 66.0 in | Wt 228.0 lb

## 2019-08-04 DIAGNOSIS — Z0000 Encounter for general adult medical examination without abnormal findings: Secondary | ICD-10-CM | POA: Diagnosis not present

## 2019-08-04 NOTE — Patient Instructions (Addendum)
  [name] , Thank you for taking time to come for your Medicare Wellness Visit. I appreciate your ongoing commitment to your health goals. Please review the following plan we discussed and let me know if I can assist you in the future.   These are the goals we discussed: Goals    . Follow up with Primary Care Provider     As needed       This is a list of the screening recommended for you and due dates:  Health Maintenance  Topic Date Due  . Mammogram  08/15/2018  . DEXA scan (bone density measurement)  08/25/2019*  . Tetanus Vaccine  08/25/2019*  . Colon Cancer Screening  04/17/2024  . COVID-19 Vaccine  Completed  .  Hepatitis C: One time screening is recommended by Center for Disease Control  (CDC) for  adults born from [birth year] through 1964.   Completed  . Flu Shot  Discontinued  . Pneumonia vaccines  Discontinued  *Topic was postponed. The date shown is not the original due date.

## 2019-08-04 NOTE — Progress Notes (Addendum)
Subjective:   Kylie Diaz is a 74 y.o. female who presents for a subsequent Medicare Annual Wellness Visit.  Review of Systems    No ROS.  Medicare Wellness Virtual Visit.  See social history for additional risk factors.  Cardiac Risk Factors include: advanced age (>51men, >33 women);hypertension     Objective:    Today's Vitals   08/04/19 1334  BP: 104/62  Weight: 228 lb (103.4 kg)  Height: 5\' 6"  (1.676 m)   Body mass index is 36.8 kg/m.  Advanced Directives 08/04/2019 03/27/2016  Does Patient Have a Medical Advance Directive? No No  Would patient like information on creating a medical advance directive? Yes (MAU/Ambulatory/Procedural Areas - Information given) Yes (MAU/Ambulatory/Procedural Areas - Information given)    Current Medications (verified) Outpatient Encounter Medications as of 08/04/2019  Medication Sig  . Calcium Carbonate-Vitamin D (CALCIUM-VITAMIN D) 500-200 MG-UNIT per tablet Take 2 tablets by mouth 3 (three) times daily with meals.  . COLLAGEN PO Take by mouth.  . hydrochlorothiazide (HYDRODIURIL) 25 MG tablet TAKE 1 TABLET EVERY DAY  . losartan (COZAAR) 25 MG tablet TAKE 1 TABLET EVERY DAY  . Omega-3 Fatty Acids (FISH OIL) 1000 MG CAPS Take 1 capsule by mouth daily.  . pantoprazole (PROTONIX) 20 MG tablet Take 1 tablet (20 mg total) by mouth daily. Take 30 minutes before your evening meal  . potassium chloride (KLOR-CON) 10 MEQ tablet TAKE 1 TABLET(10 MEQ) BY MOUTH DAILY  . vitamin E 100 UNIT capsule Take 100 Units by mouth daily.   No facility-administered encounter medications on file as of 08/04/2019.    Allergies (verified) Lisinopril   History: Past Medical History:  Diagnosis Date  . BMI 34.0-34.9,adult 04/11/2013  . Hypertension    Past Surgical History:  Procedure Laterality Date  . ABDOMINAL HYSTERECTOMY    . VAGINAL DELIVERY  2   Family History  Problem Relation Age of Onset  . Cancer Mother        unsure type  . Dementia  Mother   . Aneurysm Sister   . Cancer Sister   . Cancer Brother        STOMACH  . Diabetes Brother   . Diabetes Sister   . Heart disease Sister    Social History   Socioeconomic History  . Marital status: Married    Spouse name: Not on file  . Number of children: Not on file  . Years of education: Not on file  . Highest education level: Not on file  Occupational History  . Not on file  Tobacco Use  . Smoking status: Former Smoker    Types: Cigarettes    Quit date: 11/03/1978    Years since quitting: 40.7  . Smokeless tobacco: Never Used  . Tobacco comment: 1980  Substance and Sexual Activity  . Alcohol use: Yes    Comment: social  . Drug use: No  . Sexual activity: Not Currently    Partners: Male    Birth control/protection: Surgical    Comment: hysterectomy.  Other Topics Concern  . Not on file  Social History Narrative   Lives in [place].      School - BA in [employer]      Work - Oceanographer   Social Determinants of Radio broadcast assistant Strain:   . Difficulty of Paying Living Expenses:   Food Insecurity:   . Worried About Charity fundraiser in the Last Year:   . [employer]  of Food in the Last Year:   Transportation Needs:   . Film/video editor (Medical):   Marland Kitchen Lack of Transportation (Non-Medical):   Physical Activity:   . Days of Exercise per Week:   . Minutes of Exercise per Session:   Stress:   . Feeling of Stress :   Social Connections:   . Frequency of Communication with Friends and Family:   . Frequency of Social Gatherings with Friends and Family:   . Attends Religious Services:   . Active Member of Clubs or Organizations:   . Attends Archivist Meetings:   Marland Kitchen Marital Status:     Tobacco Counseling Counseling given: Not Answered Comment: 1980   Clinical Intake:  Pre-visit preparation completed: Yes        Diabetes: No  How often do you need to have someone help you when you read instructions,  pamphlets, or other written materials from your doctor or pharmacy?: 1 - Never  Interpreter Needed?: No      Activities of Daily Living In your present state of health, do you have any difficulty performing the following activities: 08/04/2019  Hearing? N  Vision? N  Difficulty concentrating or making decisions? N  Walking or climbing stairs? N  Dressing or bathing? N  Doing errands, shopping? N  Preparing Food and eating ? N  Using the Toilet? N  In the past six months, have you accidently leaked urine? N  Do you have problems with loss of bowel control? N  Managing your Medications? N  Managing your Finances? N  Housekeeping or managing your Housekeeping? N  Some recent data might be hidden     Immunizations and Health Maintenance Immunization History  Administered Date(s) Administered  . PFIZER SARS-COV-2 Vaccination 04/28/2019, 05/23/2019  . Tdap 03/22/2009   Health Maintenance Due  Topic Date Due  . MAMMOGRAM  08/15/2018    Patient Care Team: Einar Pheasant, MD as PCP - General (Internal Medicine)  Indicate any recent Medical Services you may have received from other than [facility] providers in the past year (date may be approximate).     Assessment:   This is a routine wellness examination for Kylie Diaz.  I connected with Kylie Diaz today by telephone and verified that I am speaking with the correct person using two identifiers. Location patient: home Location provider: work Persons participating in the virtual visit: patient, provider.   I discussed the limitations, risks, security and privacy concerns of performing an evaluation and management service by telephone and the availability of in person appointments. I also discussed with the patient that there may be a patient responsible charge related to this service. The patient expressed understanding and verbally consented to this telephonic visit.    Interactive audio and video telecommunications were  attempted between this provider and patient, however failed, due to patient having technical difficulties OR patient did not have access to video capability.  We continued and completed visit with audio only.  Some vital signs may be absent or patient reported.   Time Spent with patient on telephone encounter: 30 minutes  Hearing/Vision screen  Hearing Screening   125Hz  250Hz  500Hz  1000Hz  2000Hz  3000Hz  4000Hz  6000Hz  8000Hz   Right ear:           Left ear:           Comments: Patient is able to hear conversational tones without difficulty.  No issues reported.   Vision Screening Comments: Visual acuity not assessed, virtual visit.  They have  seen their ophthalmologist in the last 12 months.     Dietary issues and exercise activities discussed: Healthy diet Stay active Stay hydrated    Goals Addressed            This Visit's Progress   . Follow up with Primary Care Provider       As needed      Depression Screen PHQ 2/9 Scores 08/04/2019 04/21/2019 12/13/2018 03/27/2016 09/19/2015 04/17/2014 04/11/2013  PHQ - 2 Score 0 4 - 2 0 0 0  PHQ- 9 Score - 4 - - - - -  Exception Documentation - - Patient refusal - - - -    Fall Risk Fall Risk  08/04/2019 12/13/2018 03/27/2016 09/19/2015 04/17/2014  Falls in the past year? 0 0 No No No  Number falls in past yr: 0 - - - -  Follow up Falls evaluation completed Falls evaluation completed - - -   DME ORDERS:  DME order needed?  No  TIMED UP AND GO:  Was the test performed? no, virtual visit.  Cognitive Function:     6CIT Screen 08/04/2019 03/27/2016  What Year? 0 points 0 points  What month? 0 points 0 points  What time? 0 points 0 points  Count back from 20 0 points 0 points  Months in reverse 0 points 0 points  Repeat phrase 0 points -  Total Score 0 -    Screening Tests Health Maintenance  Topic Date Due  . MAMMOGRAM  08/15/2018  . DEXA SCAN  08/25/2019 (Originally 09/22/2011)  . TETANUS/TDAP  08/25/2019 (Originally 03/23/2019)    . COLONOSCOPY  04/17/2024  . COVID-19 Vaccine  Completed  . Hepatitis C Screening  Completed  . INFLUENZA VACCINE  Discontinued  . PNA vac Low Risk Adult  Discontinued   Tdap and Shingles Vaccine: Education has been provided regarding the importance of this vaccine. Advised may receive this vaccine at local pharmacy or Health Dept. Aware to provide a copy of the vaccination record if obtained from local pharmacy or Health Dept. Verbalized acceptance and understanding. Deferred per patient  Covid-19 Vaccine: Completed vaccines.  Cancer Screenings:  Colorectal Screening: Completed 04/17/14. Repeat every 10 years.   Mammogram: Completed 08/14/16. Repeat every 2 year. Ordered 12/18/18. Pt provided with contact info and advised to call to schedule appt.   Bone Density: Deferred per patient   Additional Screening:  Hepatitis C Screening: Completed 09/23/15.   Vision Screening: Recommended annual ophthalmology exams for early detection of glaucoma and other disorders of the eye. Is the patient up to date with their annual eye exam?  Yes Who is the provider or what is the name of the office in which the pt attends annual eye exams? [facility]  Dental Screening: Recommended annual dental exams for proper oral hygiene. Patient requests dentist for scheduling. Deferred to pcp. Dr. Thereasa Parkin or Dr. Kathrin Penner suggested, phone number and address provided to patient.  Community Resource Referral:  CRR required this visit?  No     Plan:  I have personally reviewed and addressed the Medicare Annual Wellness questionnaire and have noted the following in the patient's chart:  A. Medical and social history B. Use of alcohol, tobacco or illicit drugs  C. Current medications and supplements D. Functional ability and status E.  Nutritional status F.  Physical activity G. Advance directives H. List of other physicians I.  Hospitalizations, surgeries, and ER visits in previous 12 months J.   Vitals K. Screenings such as hearing  and vision if needed, cognitive and depression L. Referrals and appointments   I have reviewed and discussed with patient certain preventive protocols, quality metrics, and best practice recommendations.    Carie Caddy, LPN   [id number]  Nurse Health Advisor   Nurse Notes:   Keep all routine maintenance appointments.   Follow up 08/25/19 @ 10:00  [place] sit/stand exercise information mailed to patient per request. Encouraged to do only within patient limit.   End of life planning; Advanced aging; Advanced directives discussed.  No HCPOA/Living Will.  Additional information mailed to help them start the conversation with family, per patient request.   Reviewed above information.  Agree with assessment and plan.    Dr Nicki Reaper

## 2019-08-07 ENCOUNTER — Telehealth: Payer: Self-pay | Admitting: Internal Medicine

## 2019-08-07 DIAGNOSIS — Z01812 Encounter for preprocedural laboratory examination: Secondary | ICD-10-CM | POA: Diagnosis not present

## 2019-08-07 NOTE — Telephone Encounter (Signed)
Pt asked to speak to you but would not disclose what it is about. Please advise.

## 2019-08-07 NOTE — Telephone Encounter (Signed)
Noted. Will follow.  

## 2019-08-08 ENCOUNTER — Encounter: Payer: Self-pay | Admitting: Internal Medicine

## 2019-08-10 ENCOUNTER — Encounter: Payer: Self-pay | Admitting: Internal Medicine

## 2019-08-10 DIAGNOSIS — K621 Rectal polyp: Secondary | ICD-10-CM | POA: Diagnosis not present

## 2019-08-10 DIAGNOSIS — D120 Benign neoplasm of cecum: Secondary | ICD-10-CM | POA: Diagnosis not present

## 2019-08-10 DIAGNOSIS — Z1211 Encounter for screening for malignant neoplasm of colon: Secondary | ICD-10-CM | POA: Diagnosis not present

## 2019-08-10 DIAGNOSIS — K635 Polyp of colon: Secondary | ICD-10-CM | POA: Diagnosis not present

## 2019-08-10 DIAGNOSIS — K6289 Other specified diseases of anus and rectum: Secondary | ICD-10-CM | POA: Diagnosis not present

## 2019-08-10 DIAGNOSIS — K648 Other hemorrhoids: Secondary | ICD-10-CM | POA: Diagnosis not present

## 2019-08-11 LAB — HM COLONOSCOPY

## 2019-08-25 ENCOUNTER — Encounter: Payer: Self-pay | Admitting: Internal Medicine

## 2019-08-25 ENCOUNTER — Other Ambulatory Visit: Payer: Self-pay

## 2019-08-25 ENCOUNTER — Ambulatory Visit (INDEPENDENT_AMBULATORY_CARE_PROVIDER_SITE_OTHER): Payer: Medicare HMO | Admitting: Internal Medicine

## 2019-08-25 VITALS — BP 120/78 | HR 83 | Temp 97.2°F | Resp 16 | Ht 66.0 in | Wt 227.2 lb

## 2019-08-25 DIAGNOSIS — D473 Essential (hemorrhagic) thrombocythemia: Secondary | ICD-10-CM

## 2019-08-25 DIAGNOSIS — D649 Anemia, unspecified: Secondary | ICD-10-CM

## 2019-08-25 DIAGNOSIS — I10 Essential (primary) hypertension: Secondary | ICD-10-CM

## 2019-08-25 DIAGNOSIS — D75839 Thrombocytosis, unspecified: Secondary | ICD-10-CM

## 2019-08-25 LAB — BASIC METABOLIC PANEL
BUN: 9 mg/dL (ref 6–23)
CO2: 30 mEq/L (ref 19–32)
Calcium: 9.8 mg/dL (ref 8.4–10.5)
Chloride: 105 mEq/L (ref 96–112)
Creatinine, Ser: 0.83 mg/dL (ref 0.40–1.20)
GFR: 81.55 mL/min (ref >60.00)
Glucose, Bld: 90 mg/dL (ref 70–99)
Potassium: 3.7 mEq/L (ref 3.5–5.1)
Sodium: 139 mEq/L (ref 135–145)

## 2019-08-25 LAB — FERRITIN: Ferritin: 268.3 ng/mL (ref 10.0–291.0)

## 2019-08-25 LAB — LIPID PANEL
Cholesterol: 153 mg/dL (ref 0–200)
HDL: 51.5 mg/dL (ref >39.00)
LDL Cholesterol: 86 mg/dL (ref 0–99)
NonHDL: 101.35
Total CHOL/HDL Ratio: 3
Triglycerides: 78 mg/dL (ref 0.0–149.0)
VLDL: 15.6 mg/dL (ref 0.0–40.0)

## 2019-08-25 LAB — CBC WITH DIFFERENTIAL/PLATELET
Basophils Absolute: 0 10*3/uL (ref 0.0–0.1)
Basophils Relative: 0.6 % (ref 0.0–3.0)
Eosinophils Absolute: 0.1 10*3/uL (ref 0.0–0.7)
Eosinophils Relative: 1.3 % (ref 0.0–5.0)
HCT: 33.8 % — ABNORMAL LOW (ref 36.0–46.0)
Hemoglobin: 11.1 g/dL — ABNORMAL LOW (ref 12.0–15.0)
Lymphocytes Relative: 18.9 % (ref 12.0–46.0)
Lymphs Abs: 1.2 10*3/uL (ref 0.7–4.0)
MCHC: 32.7 g/dL (ref 30.0–36.0)
MCV: 86.2 fl (ref 78.0–100.0)
Monocytes Absolute: 0.5 10*3/uL (ref 0.1–1.0)
Monocytes Relative: 8.1 % (ref 3.0–12.0)
Neutro Abs: 4.7 10*3/uL (ref 1.4–7.7)
Neutrophils Relative %: 71.1 % (ref 43.0–77.0)
Platelets: 369 10*3/uL (ref 150.0–400.0)
RBC: 3.92 Mil/uL (ref 3.87–5.11)
RDW: 14.2 % (ref 11.5–15.5)
WBC: 6.6 10*3/uL (ref 4.0–10.5)

## 2019-08-25 LAB — HEPATIC FUNCTION PANEL
ALT: 14 U/L (ref 0–35)
AST: 18 U/L (ref 0–37)
Albumin: 4.2 g/dL (ref 3.5–5.2)
Alkaline Phosphatase: 59 U/L (ref 39–117)
Bilirubin, Direct: 0.1 mg/dL (ref 0.0–0.3)
Total Bilirubin: 0.5 mg/dL (ref 0.2–1.2)
Total Protein: 7 g/dL (ref 6.0–8.3)

## 2019-08-25 NOTE — Progress Notes (Signed)
Patient ID: Kylie Diaz, female   DOB: 02/21/1946, 74 y.o.   MRN: [id number]   Subjective:    Patient ID: Kylie Diaz, female    DOB: [birth year], 74 y.o.   MRN: [id number]  HPI This visit occurred during the SARS-CoV-2 public health emergency.  Safety protocols were in place, including screening questions prior to the visit, additional usage of staff PPE, and extensive cleaning of exam room while observing appropriate contact time as indicated for disinfecting solutions.  Patient here for a scheduled follow up.  She is doing well.  Feels good.  Staying active.  No chest pain or sob reported.  No abdominal pain.  Bowels moving.  Handling stress.  Taking her medications regularly.  Blood pressure has been doing well. Desires not to schedule mammogram at this time.    Past Medical History:  Diagnosis Date  . BMI 34.0-34.9,adult 04/11/2013  . Hypertension    Past Surgical History:  Procedure Laterality Date  . ABDOMINAL HYSTERECTOMY    . VAGINAL DELIVERY  2   Family History  Problem Relation Age of Onset  . Cancer Mother        unsure type  . Dementia Mother   . Aneurysm Sister   . Cancer Sister   . Cancer Brother        STOMACH  . Diabetes Brother   . Diabetes Sister   . Heart disease Sister    Social History   Socioeconomic History  . Marital status: Married    Spouse name: Not on file  . Number of children: Not on file  . Years of education: Not on file  . Highest education level: Not on file  Occupational History  . Not on file  Tobacco Use  . Smoking status: Former Smoker    Types: Cigarettes    Quit date: 11/03/1978    Years since quitting: 40.8  . Smokeless tobacco: Never Used  . Tobacco comment: 1980  Substance and Sexual Activity  . Alcohol use: Yes    Comment: social  . Drug use: No  . Sexual activity: Not Currently    Partners: Male    Birth control/protection: Surgical    Comment: hysterectomy.  Other Topics Concern  . Not on file  Social  History Narrative   Lives in [place].      School - BA in [employer]      Work - Oceanographer   Social Determinants of Radio broadcast assistant Strain:   . Difficulty of Paying Living Expenses:   Food Insecurity:   . Worried About Charity fundraiser in the Last Year:   . Arboriculturist in the Last Year:   Transportation Needs:   . Film/video editor (Medical):   Marland Kitchen Lack of Transportation (Non-Medical):   Physical Activity:   . Days of Exercise per Week:   . Minutes of Exercise per Session:   Stress:   . Feeling of Stress :   Social Connections:   . Frequency of Communication with Friends and Family:   . Frequency of Social Gatherings with Friends and Family:   . Attends Religious Services:   . Active Member of Clubs or Organizations:   . Attends Archivist Meetings:   Marland Kitchen Marital Status:     Outpatient Encounter Medications as of 08/25/2019  Medication Sig  . Calcium Carbonate-Vitamin D (CALCIUM-VITAMIN D) 500-200 MG-UNIT per tablet Take 2 tablets by mouth 3 (three) times daily  with meals.  . COLLAGEN PO Take by mouth.  . hydrochlorothiazide (HYDRODIURIL) 25 MG tablet TAKE 1 TABLET EVERY DAY  . losartan (COZAAR) 25 MG tablet TAKE 1 TABLET EVERY DAY  . Omega-3 Fatty Acids (FISH OIL) 1000 MG CAPS Take 1 capsule by mouth daily.  . pantoprazole (PROTONIX) 20 MG tablet Take 1 tablet (20 mg total) by mouth daily. Take 30 minutes before your evening meal  . potassium chloride (KLOR-CON) 10 MEQ tablet TAKE 1 TABLET(10 MEQ) BY MOUTH DAILY  . vitamin E 100 UNIT capsule Take 100 Units by mouth daily.   No facility-administered encounter medications on file as of 08/25/2019.    Review of Systems  Constitutional: Negative for appetite change and unexpected weight change.  HENT: Negative for congestion and sinus pressure.   Respiratory: Negative for cough, chest tightness and shortness of breath.   Cardiovascular: Negative for chest pain, palpitations  and leg swelling.  Gastrointestinal: Negative for abdominal pain, diarrhea, nausea and vomiting.  Genitourinary: Negative for difficulty urinating and dysuria.  Musculoskeletal: Negative for joint swelling and myalgias.  Skin: Negative for color change and rash.  Neurological: Negative for dizziness, light-headedness and headaches.  Psychiatric/Behavioral: Negative for agitation and dysphoric mood.       Objective:    Physical Exam Vitals reviewed.  Constitutional:      General: She is not in acute distress.    Appearance: Normal appearance.  HENT:     Head: Normocephalic and atraumatic.     Right Ear: External ear normal.     Left Ear: External ear normal.  Eyes:     General: No scleral icterus.       Right eye: No discharge.        Left eye: No discharge.     Conjunctiva/sclera: Conjunctivae normal.  Neck:     Thyroid: No thyromegaly.  Cardiovascular:     Rate and Rhythm: Normal rate and regular rhythm.  Pulmonary:     Effort: No respiratory distress.     Breath sounds: Normal breath sounds. No wheezing.  Abdominal:     General: Bowel sounds are normal.     Palpations: Abdomen is soft.     Tenderness: There is no abdominal tenderness.  Musculoskeletal:        General: No swelling or tenderness.     Cervical back: Neck supple. No tenderness.  Lymphadenopathy:     Cervical: No cervical adenopathy.  Skin:    Findings: No erythema or rash.  Neurological:     Mental Status: She is alert.  Psychiatric:        Mood and Affect: Mood normal.        Behavior: Behavior normal.     BP 120/78   Pulse 83   Temp (!) 97.2 F (36.2 C)   Resp 16   Ht 5\' 6"  (1.676 m)   Wt 227 lb 3.2 oz (103.1 kg)   SpO2 98%   BMI 36.67 kg/m  Wt Readings from Last 3 Encounters:  08/25/19 227 lb 3.2 oz (103.1 kg)  08/04/19 228 lb (103.4 kg)  06/20/19 228 lb (103.4 kg)     Lab Results  Component Value Date   WBC 7.0 05/12/2019   HGB 11.1 (L) 05/12/2019   HCT 34.6 (L) 05/12/2019    PLT 376.0 05/12/2019   GLUCOSE 104 (H) 05/12/2019   CHOL 160 05/12/2019   TRIG 98.0 05/12/2019   HDL 47.10 05/12/2019   LDLCALC 93 05/12/2019   ALT 9 05/12/2019  AST 12 05/12/2019   NA 141 05/12/2019   K 4.4 06/20/2019   CL 104 05/12/2019   CREATININE 0.75 05/12/2019   BUN 13 05/12/2019   CO2 32 05/12/2019   TSH 0.85 12/20/2018   HGBA1C 5.3 05/12/2019   MICROALBUR 0.7 05/01/2015    US Abdomen Complete  Result Date: 08/26/2018 CLINICAL DATA:  Right-sided abdominal pain. EXAM: ABDOMEN ULTRASOUND COMPLETE COMPARISON:  None. FINDINGS: Gallbladder: No gallstones or wall thickening visualized. No sonographic Murphy sign noted by sonographer. Common bile duct: Diameter: 3 mm Liver: Heterogeneous increased liver parenchymal echogenicity. No mass or focal lesion. Portal vein is patent on color Doppler imaging with normal direction of blood flow towards the liver. IVC: No abnormality visualized. Pancreas: Visualized portion unremarkable. Spleen: Size and appearance within normal limits. Right Kidney: Length: 10.5 cm. Echogenicity within normal limits. No mass or hydronephrosis visualized. Left Kidney: Length: 10.6 cm. Echogenicity within normal limits. No mass or hydronephrosis visualized. Abdominal aorta: No aneurysm visualized. Other findings: None. IMPRESSION: 1. No acute findings.  Normal gallbladder.  No bile duct dilation. 2. Heterogeneous increased echogenicity of the liver parenchyma consistent with hepatic steatosis. No other abnormalities. Electronically Signed   By: Lajean Manes M.D.   On: 08/26/2018 13:58       Assessment & Plan:   Problem List Items Addressed This Visit    Anemia    Follow cbc.       Relevant Orders   CBC with Differential/Platelet   Ferritin   Hypertension - Primary    Blood pressure doing well.  Continue losartan and hctz.  Follow pressures.  Follow metabolic panel.       Relevant Orders   Hepatic function panel   Lipid panel   Basic metabolic panel     Hemoglobin A1c   Thrombocytosis (HCC)    Follow cbc.            Einar Pheasant, MD

## 2019-08-26 ENCOUNTER — Encounter: Payer: Self-pay | Admitting: Internal Medicine

## 2019-08-26 NOTE — Assessment & Plan Note (Signed)
Follow cbc.  

## 2019-08-26 NOTE — Assessment & Plan Note (Signed)
Blood pressure doing well.  Continue losartan and hctz.  Follow pressures.  Follow metabolic panel.  

## 2019-08-28 LAB — HEMOGLOBIN A1C: Hgb A1c MFr Bld: 5.5 % (ref 4.6–6.5)

## 2019-09-15 ENCOUNTER — Encounter: Payer: Self-pay | Admitting: Internal Medicine

## 2019-09-15 ENCOUNTER — Telehealth: Payer: Self-pay

## 2019-09-15 NOTE — Telephone Encounter (Signed)
Error

## 2019-09-18 ENCOUNTER — Other Ambulatory Visit: Payer: Self-pay | Admitting: Internal Medicine

## 2019-09-20 NOTE — Telephone Encounter (Signed)
What questions did she have?

## 2019-10-26 ENCOUNTER — Other Ambulatory Visit: Payer: Self-pay | Admitting: Internal Medicine

## 2019-10-26 ENCOUNTER — Encounter: Payer: Self-pay | Admitting: Internal Medicine

## 2019-10-26 DIAGNOSIS — I10 Essential (primary) hypertension: Secondary | ICD-10-CM

## 2019-12-11 ENCOUNTER — Ambulatory Visit: Payer: Medicare HMO | Attending: Internal Medicine

## 2019-12-11 DIAGNOSIS — Z23 Encounter for immunization: Secondary | ICD-10-CM

## 2019-12-11 NOTE — Progress Notes (Signed)
   Covid-[facility] Vaccination [facility]  Name:  Kylie Diaz    MRN: [id number] DOB: 03-14-1946  12/11/2019  [name] was observed post Covid-19 immunization for 15 minutes without incident. She was provided with Vaccine Information Sheet and instruction to access the V-Safe system.   [name] was instructed to call 911 with any severe reactions post vaccine: Marland Kitchen Difficulty breathing  . Swelling of face and throat  . A fast heartbeat  . A bad rash all over body  . Dizziness and weakness

## 2019-12-26 ENCOUNTER — Encounter: Payer: Medicare HMO | Admitting: Internal Medicine

## 2020-01-01 ENCOUNTER — Encounter: Payer: Self-pay | Admitting: Internal Medicine

## 2020-02-01 ENCOUNTER — Other Ambulatory Visit: Payer: Self-pay | Admitting: Internal Medicine

## 2020-02-12 ENCOUNTER — Telehealth: Payer: Self-pay | Admitting: Internal Medicine

## 2020-02-12 DIAGNOSIS — Z1231 Encounter for screening mammogram for malignant neoplasm of breast: Secondary | ICD-10-CM

## 2020-02-12 NOTE — Telephone Encounter (Signed)
Received notification that she is overdue mammogram.  Last one I see is 2018.  Need to schedule.  Thanks

## 2020-02-13 NOTE — Telephone Encounter (Signed)
Patient has appt on 12/16. Will discuss at upcoming appt.

## 2020-02-19 ENCOUNTER — Encounter: Payer: Self-pay | Admitting: Internal Medicine

## 2020-02-21 ENCOUNTER — Telehealth: Payer: Self-pay | Admitting: Internal Medicine

## 2020-02-21 NOTE — Telephone Encounter (Signed)
Patient called back to give time the call came in  A liitle after 1 pm on 02-16-20 did not see where anyone called her in system

## 2020-02-21 NOTE — Telephone Encounter (Signed)
Patient called office and said someone from this office called, no message left.

## 2020-02-29 ENCOUNTER — Encounter: Payer: Medicare HMO | Admitting: Internal Medicine

## 2020-03-11 ENCOUNTER — Encounter: Payer: Medicare HMO | Admitting: Internal Medicine

## 2020-04-11 ENCOUNTER — Other Ambulatory Visit: Payer: Self-pay

## 2020-04-11 ENCOUNTER — Ambulatory Visit (INDEPENDENT_AMBULATORY_CARE_PROVIDER_SITE_OTHER): Payer: Medicare HMO | Admitting: Internal Medicine

## 2020-04-11 ENCOUNTER — Encounter: Payer: Self-pay | Admitting: Internal Medicine

## 2020-04-11 VITALS — BP 118/76 | HR 105 | Temp 98.1°F | Ht 66.0 in | Wt 229.1 lb

## 2020-04-11 DIAGNOSIS — R739 Hyperglycemia, unspecified: Secondary | ICD-10-CM | POA: Diagnosis not present

## 2020-04-11 DIAGNOSIS — I10 Essential (primary) hypertension: Secondary | ICD-10-CM

## 2020-04-11 DIAGNOSIS — D649 Anemia, unspecified: Secondary | ICD-10-CM

## 2020-04-11 DIAGNOSIS — K219 Gastro-esophageal reflux disease without esophagitis: Secondary | ICD-10-CM

## 2020-04-11 DIAGNOSIS — Z1231 Encounter for screening mammogram for malignant neoplasm of breast: Secondary | ICD-10-CM

## 2020-04-11 DIAGNOSIS — Z0000 Encounter for general adult medical examination without abnormal findings: Secondary | ICD-10-CM | POA: Diagnosis not present

## 2020-04-11 NOTE — Progress Notes (Signed)
Patient ID: Kylie Diaz, female   DOB: 01/20/1946, 75 y.o.   MRN: [id number]   Subjective:    Patient ID: Kylie Diaz, female    DOB: Apr 30, 1945, 75 y.o.   MRN: [id number]  HPI This visit occurred during the SARS-CoV-2 public health emergency.  Safety protocols were in place, including screening questions prior to the visit, additional usage of staff PPE, and extensive cleaning of exam room while observing appropriate contact time as indicated for disinfecting solutions.  Patient here for her physical.  She reports she is doing relatively well.  Trying to stay active.  No chest pain or sob reported.  No abdominal pain or bowel change reported.  Blood pressure doing well.  She had questions about her medication.  Wanted to combine her losartan and hctz.  Discussed that her dosing combination was not combined into one pill.  Handling stress.  Overall she feels she is doing well.   Past Medical History:  Diagnosis Date  . BMI 34.0-34.9,adult 04/11/2013  . Hypertension    Past Surgical History:  Procedure Laterality Date  . ABDOMINAL HYSTERECTOMY    . VAGINAL DELIVERY  2   Family History  Problem Relation Age of Onset  . Cancer Mother        unsure type  . Dementia Mother   . Aneurysm Sister   . Cancer Sister   . Cancer Brother        STOMACH  . Diabetes Brother   . Diabetes Sister   . Heart disease Sister    Social History   Socioeconomic History  . Marital status: Married    Spouse name: Not on file  . Number of children: Not on file  . Years of education: Not on file  . Highest education level: Not on file  Occupational History  . Not on file  Tobacco Use  . Smoking status: Former Smoker    Types: Cigarettes    Quit date: 11/03/1978    Years since quitting: 41.4  . Smokeless tobacco: Never Used  . Tobacco comment: 1980  Substance and Sexual Activity  . Alcohol use: Yes    Comment: social  . Drug use: No  . Sexual activity: Not Currently    Partners: Male     Birth control/protection: Surgical    Comment: hysterectomy.  Other Topics Concern  . Not on file  Social History Narrative   Lives in [place].      School - BA in [employer]      Work - Oceanographer   Social Determinants of Radio broadcast assistant Strain: Not on [employer] Insecurity: Not on file  Transportation Needs: Not on file  Physical Activity: Not on file  Stress: Not on file  Social Connections: Not on file    Outpatient Encounter Medications as of 04/11/2020  Medication Sig  . Calcium Carbonate-Vitamin D (CALCIUM-VITAMIN D) 500-200 MG-UNIT per tablet Take 2 tablets by mouth 3 (three) times daily with meals.  . COLLAGEN PO Take by mouth.  . Omega-3 Fatty Acids (FISH OIL) 1000 MG CAPS Take 1 capsule by mouth daily.  . pantoprazole (PROTONIX) 20 MG tablet Take 1 tablet (20 mg total) by mouth daily. Take 30 minutes before your evening meal  . potassium chloride (KLOR-CON) 10 MEQ tablet TAKE 1 TABLET(10 MEQ) BY MOUTH DAILY  . vitamin E 100 UNIT capsule Take 100 Units by mouth daily.  . [DISCONTINUED] hydrochlorothiazide (HYDRODIURIL) 25 MG tablet TAKE 1  TABLET EVERY DAY  . [DISCONTINUED] losartan (COZAAR) 25 MG tablet TAKE 1 TABLET EVERY DAY   No facility-administered encounter medications on file as of 04/11/2020.    Review of Systems  Constitutional: Negative for appetite change and unexpected weight change.  HENT: Negative for congestion, sinus pressure and sore throat.   Eyes: Negative for pain and visual disturbance.  Respiratory: Negative for cough, chest tightness and shortness of breath.   Cardiovascular: Negative for chest pain and palpitations.  Gastrointestinal: Negative for abdominal pain, diarrhea, nausea and vomiting.  Genitourinary: Negative for difficulty urinating and dysuria.  Musculoskeletal: Negative for back pain and joint swelling.  Skin: Negative for color change and rash.  Neurological: Negative for dizziness, light-headedness  and headaches.  Hematological: Negative for adenopathy. Does not bruise/bleed easily.  Psychiatric/Behavioral: Negative for agitation and dysphoric mood.       Objective:    Physical Exam Vitals reviewed.  Constitutional:      General: She is not in acute distress.    Appearance: Normal appearance. She is well-developed and well-nourished.  HENT:     Head: Normocephalic and atraumatic.     Right Ear: External ear normal.     Left Ear: External ear normal.     Mouth/Throat:     Mouth: Oropharynx is clear and moist.  Eyes:     General: No scleral icterus.       Right eye: No discharge.        Left eye: No discharge.     Conjunctiva/sclera: Conjunctivae normal.  Neck:     Thyroid: No thyromegaly.  Cardiovascular:     Rate and Rhythm: Normal rate and regular rhythm.  Pulmonary:     Effort: No tachypnea, accessory muscle usage or respiratory distress.     Breath sounds: Normal breath sounds. No decreased breath sounds or wheezing.  Chest:  Breasts:     Right: No inverted nipple, mass, nipple discharge or tenderness (no axillary adenopathy).     Left: No inverted nipple, mass, nipple discharge or tenderness (no axilarry adenopathy).    Abdominal:     General: Bowel sounds are normal.     Palpations: Abdomen is soft.     Tenderness: There is no abdominal tenderness.  Musculoskeletal:        General: No swelling, tenderness or edema.     Cervical back: Neck supple. No tenderness.  Lymphadenopathy:     Cervical: No cervical adenopathy.  Skin:    Findings: No erythema or rash.  Neurological:     Mental Status: She is alert and oriented to person, place, and time.  Psychiatric:        Mood and Affect: Mood and affect and mood normal.        Behavior: Behavior normal.    BP 118/76   Pulse (!) 105   Temp 98.1 F (36.7 C)   Ht 5\' 6"  (1.676 m)   Wt 229 lb 2 oz (103.9 kg)   SpO2 99%   BMI 36.98 kg/m  Wt Readings from Last 3 Encounters:  04/11/20 229 lb 2 oz (103.9 kg)   08/25/19 227 lb 3.2 oz (103.1 kg)  08/04/19 228 lb (103.4 kg)     Lab Results  Component Value Date   WBC 6.6 08/25/2019   HGB 11.1 (L) 08/25/2019   HCT 33.8 (L) 08/25/2019   PLT 369.0 08/25/2019   GLUCOSE 90 08/25/2019   CHOL 153 08/25/2019   TRIG 78.0 08/25/2019   HDL 51.50 08/25/2019  LDLCALC 86 08/25/2019   ALT 14 08/25/2019   AST 18 08/25/2019   NA 139 08/25/2019   K 3.7 08/25/2019   CL 105 08/25/2019   CREATININE 0.83 08/25/2019   BUN 9 08/25/2019   CO2 30 08/25/2019   TSH 0.85 12/20/2018   HGBA1C 5.5 08/25/2019   MICROALBUR 0.7 05/01/2015    US Abdomen Complete  Result Date: 08/26/2018 CLINICAL DATA:  Right-sided abdominal pain. EXAM: ABDOMEN ULTRASOUND COMPLETE COMPARISON:  None. FINDINGS: Gallbladder: No gallstones or wall thickening visualized. No sonographic Murphy sign noted by sonographer. Common bile duct: Diameter: 3 mm Liver: Heterogeneous increased liver parenchymal echogenicity. No mass or focal lesion. Portal vein is patent on color Doppler imaging with normal direction of blood flow towards the liver. IVC: No abnormality visualized. Pancreas: Visualized portion unremarkable. Spleen: Size and appearance within normal limits. Right Kidney: Length: 10.5 cm. Echogenicity within normal limits. No mass or hydronephrosis visualized. Left Kidney: Length: 10.6 cm. Echogenicity within normal limits. No mass or hydronephrosis visualized. Abdominal aorta: No aneurysm visualized. Other findings: None. IMPRESSION: 1. No acute findings.  Normal gallbladder.  No bile duct dilation. 2. Heterogeneous increased echogenicity of the liver parenchyma consistent with hepatic steatosis. No other abnormalities. Electronically Signed   By: Lajean Manes M.D.   On: 08/26/2018 13:58       Assessment & Plan:   Problem List Items Addressed This Visit    Anemia    Follow cbc.       Relevant Orders   CBC with Differential/Platelet   Vitamin B12   IBC + Ferritin   Esophageal  reflux    No upper symptoms reported.  On protonix.       Healthcare maintenance    Physical today 04/11/20.  Mammogram overdue.  Need to schedule.  Discussed.  Colonoscopy 07/2019 as outlined in overview.  Recommended f/u colonoscopy in 5-7 years.        Hypertension    Continue losartan and hctz.  Blood pressure is doing well.  Follow pressures.  Follow metabolic panel.        Relevant Orders   Hepatic function panel   Lipid panel   TSH   Basic metabolic panel    Other Visit Diagnoses    Routine general medical examination at a health care facility    -  Primary   Encounter for screening mammogram for malignant neoplasm of breast       Relevant Orders   MM 3D SCREEN BREAST BILATERAL   Hyperglycemia       Relevant Orders   Hemoglobin A1c       Einar Pheasant, MD

## 2020-04-11 NOTE — Assessment & Plan Note (Addendum)
Physical today 04/11/20.  Mammogram overdue.  Need to schedule.  Discussed.  Colonoscopy 07/2019 as outlined in overview.  Recommended f/u colonoscopy in 5-7 years.

## 2020-04-12 ENCOUNTER — Encounter: Payer: Self-pay | Admitting: Internal Medicine

## 2020-04-12 DIAGNOSIS — I10 Essential (primary) hypertension: Secondary | ICD-10-CM

## 2020-04-12 MED ORDER — HYDROCHLOROTHIAZIDE 25 MG PO TABS: 25.0000 mg | ORAL_TABLET | Freq: Every day | ORAL | 1 refills | Status: DC

## 2020-04-12 MED ORDER — LOSARTAN POTASSIUM 25 MG PO TABS: 25.0000 mg | ORAL_TABLET | Freq: Every day | ORAL | 1 refills | Status: DC

## 2020-04-14 ENCOUNTER — Encounter: Payer: Self-pay | Admitting: Internal Medicine

## 2020-04-14 NOTE — Assessment & Plan Note (Signed)
No upper symptoms reported.  On protonix.   

## 2020-04-14 NOTE — Assessment & Plan Note (Signed)
Follow cbc.  

## 2020-04-14 NOTE — Assessment & Plan Note (Signed)
Continue losartan and hctz.  Blood pressure is doing well.  Follow pressures.  Follow metabolic panel.

## 2020-04-17 ENCOUNTER — Encounter: Payer: Self-pay | Admitting: Internal Medicine

## 2020-04-18 ENCOUNTER — Other Ambulatory Visit (INDEPENDENT_AMBULATORY_CARE_PROVIDER_SITE_OTHER): Payer: Medicare HMO

## 2020-04-18 ENCOUNTER — Other Ambulatory Visit: Payer: Self-pay

## 2020-04-18 DIAGNOSIS — D649 Anemia, unspecified: Secondary | ICD-10-CM | POA: Diagnosis not present

## 2020-04-18 DIAGNOSIS — R739 Hyperglycemia, unspecified: Secondary | ICD-10-CM | POA: Diagnosis not present

## 2020-04-18 DIAGNOSIS — I10 Essential (primary) hypertension: Secondary | ICD-10-CM | POA: Diagnosis not present

## 2020-04-18 LAB — CBC WITH DIFFERENTIAL/PLATELET
Basophils Absolute: 0 10*3/uL (ref 0.0–0.1)
Basophils Relative: 0.3 % (ref 0.0–3.0)
Eosinophils Absolute: 0.1 10*3/uL (ref 0.0–0.7)
Eosinophils Relative: 0.7 % (ref 0.0–5.0)
HCT: 34.6 % — ABNORMAL LOW (ref 36.0–46.0)
Hemoglobin: 11.3 g/dL — ABNORMAL LOW (ref 12.0–15.0)
Lymphocytes Relative: 22.3 % (ref 12.0–46.0)
Lymphs Abs: 1.8 10*3/uL (ref 0.7–4.0)
MCHC: 32.6 g/dL (ref 30.0–36.0)
MCV: 86.1 fl (ref 78.0–100.0)
Monocytes Absolute: 0.6 10*3/uL (ref 0.1–1.0)
Monocytes Relative: 7.3 % (ref 3.0–12.0)
Neutro Abs: 5.7 10*3/uL (ref 1.4–7.7)
Neutrophils Relative %: 69.4 % (ref 43.0–77.0)
Platelets: 388 10*3/uL (ref 150.0–400.0)
RBC: 4.02 Mil/uL (ref 3.87–5.11)
RDW: 14.4 % (ref 11.5–15.5)
WBC: 8.1 10*3/uL (ref 4.0–10.5)

## 2020-04-18 LAB — LIPID PANEL
Cholesterol: 168 mg/dL (ref 0–200)
HDL: 58.2 mg/dL (ref >39.00)
LDL Cholesterol: 92 mg/dL (ref 0–99)
NonHDL: 109.6
Total CHOL/HDL Ratio: 3
Triglycerides: 86 mg/dL (ref 0.0–149.0)
VLDL: 17.2 mg/dL (ref 0.0–40.0)

## 2020-04-18 LAB — HEPATIC FUNCTION PANEL
ALT: 12 U/L (ref 0–35)
AST: 13 U/L (ref 0–37)
Albumin: 4 g/dL (ref 3.5–5.2)
Alkaline Phosphatase: 55 U/L (ref 39–117)
Bilirubin, Direct: 0.1 mg/dL (ref 0.0–0.3)
Total Bilirubin: 0.6 mg/dL (ref 0.2–1.2)
Total Protein: 6.7 g/dL (ref 6.0–8.3)

## 2020-04-18 LAB — TSH: TSH: 0.95 u[IU]/mL (ref 0.35–4.50)

## 2020-04-18 LAB — BASIC METABOLIC PANEL
BUN: 13 mg/dL (ref 6–23)
CO2: 31 mEq/L (ref 19–32)
Calcium: 10.2 mg/dL (ref 8.4–10.5)
Chloride: 104 mEq/L (ref 96–112)
Creatinine, Ser: 0.81 mg/dL (ref 0.40–1.20)
GFR: 71.94 mL/min (ref >60.00)
Glucose, Bld: 96 mg/dL (ref 70–99)
Potassium: 4 mEq/L (ref 3.5–5.1)
Sodium: 141 mEq/L (ref 135–145)

## 2020-04-18 LAB — IBC + FERRITIN
Ferritin: 240 ng/mL (ref 10.0–291.0)
Iron: 72 ug/dL (ref 42–145)
Saturation Ratios: 22 % (ref 20.0–50.0)
Transferrin: 234 mg/dL (ref 212.0–360.0)

## 2020-04-18 LAB — VITAMIN B12: Vitamin B-12: 390 pg/mL (ref 211–911)

## 2020-04-18 LAB — HEMOGLOBIN A1C: Hgb A1c MFr Bld: 5.4 % (ref 4.6–6.5)

## 2020-05-10 ENCOUNTER — Telehealth: Payer: Self-pay | Admitting: Internal Medicine

## 2020-05-10 NOTE — Telephone Encounter (Signed)
Patient was returning call for lab results 

## 2020-05-13 NOTE — Telephone Encounter (Signed)
Pt called about lab results from 04/18/20

## 2020-05-14 NOTE — Telephone Encounter (Signed)
Patient aware of lab results and would like to know if she can try dieting to get her cholesterol down before starting a cholesterol medication

## 2020-05-14 NOTE — Telephone Encounter (Signed)
We will follow her cholesterol.  Continue diet and exercise.

## 2020-05-15 NOTE — Telephone Encounter (Signed)
Left detailed message for patient.

## 2020-06-12 ENCOUNTER — Encounter: Payer: Self-pay | Admitting: Internal Medicine

## 2020-06-12 NOTE — Telephone Encounter (Signed)
Pt called to follow up on medication she called [facility] they don't have the RX

## 2020-06-18 ENCOUNTER — Other Ambulatory Visit: Payer: Self-pay

## 2020-06-18 DIAGNOSIS — I10 Essential (primary) hypertension: Secondary | ICD-10-CM

## 2020-06-18 MED ORDER — HYDROCHLOROTHIAZIDE 25 MG PO TABS: 25.0000 mg | ORAL_TABLET | Freq: Every day | ORAL | 1 refills | Status: DC

## 2020-06-18 MED ORDER — LOSARTAN POTASSIUM 25 MG PO TABS: 25.0000 mg | ORAL_TABLET | Freq: Every day | ORAL | 1 refills | Status: DC

## 2020-06-27 ENCOUNTER — Telehealth: Payer: Self-pay | Admitting: Internal Medicine

## 2020-06-27 MED ORDER — HYDROCHLOROTHIAZIDE 25 MG PO TABS: 25.0000 mg | ORAL_TABLET | Freq: Every day | ORAL | 1 refills | Status: DC

## 2020-06-27 MED ORDER — LOSARTAN POTASSIUM 25 MG PO TABS: 25.0000 mg | ORAL_TABLET | Freq: Every day | ORAL | 1 refills | Status: DC

## 2020-06-27 NOTE — Telephone Encounter (Signed)
Patient would like the following medication sent [facility] on [address] in [place], For this time only, losartan (COZAAR) 25 MG tablet, hydrochlorothiazide (HYDRODIURIL) 25 MG tablet, AFter this refill, medications will got to [facility].

## 2020-07-01 ENCOUNTER — Other Ambulatory Visit: Payer: Self-pay

## 2020-07-01 MED ORDER — LOSARTAN POTASSIUM 25 MG PO TABS: 25.0000 mg | ORAL_TABLET | Freq: Every day | ORAL | 1 refills | Status: DC

## 2020-07-01 MED ORDER — HYDROCHLOROTHIAZIDE 25 MG PO TABS: 25.0000 mg | ORAL_TABLET | Freq: Every day | ORAL | 1 refills | Status: DC

## 2020-07-01 NOTE — Telephone Encounter (Signed)
PT called in and stated that [facility] states they still not have received the medication refills for the losartan and hydrochlorothiazide

## 2020-07-01 NOTE — Addendum Note (Signed)
Addended byElpidio Galea T on: 07/01/2020 03:34 PM   Modules accepted: Orders

## 2020-07-01 NOTE — Telephone Encounter (Signed)
Medication has been sent again

## 2020-07-08 ENCOUNTER — Ambulatory Visit
Admission: RE | Admit: 2020-07-08 | Discharge: 2020-07-08 | Disposition: A | Discharge status: Home / Self Care | Payer: Medicare HMO | Source: Ambulatory Visit | Attending: Internal Medicine | Admitting: Internal Medicine

## 2020-07-08 ENCOUNTER — Other Ambulatory Visit: Payer: Self-pay

## 2020-07-08 DIAGNOSIS — Z1231 Encounter for screening mammogram for malignant neoplasm of breast: Secondary | ICD-10-CM | POA: Diagnosis not present

## 2020-08-06 ENCOUNTER — Ambulatory Visit: Payer: Medicare HMO

## 2020-08-13 ENCOUNTER — Ambulatory Visit (INDEPENDENT_AMBULATORY_CARE_PROVIDER_SITE_OTHER): Payer: Medicare HMO | Admitting: Internal Medicine

## 2020-08-13 ENCOUNTER — Other Ambulatory Visit: Payer: Self-pay

## 2020-08-13 DIAGNOSIS — D649 Anemia, unspecified: Secondary | ICD-10-CM | POA: Diagnosis not present

## 2020-08-13 DIAGNOSIS — K219 Gastro-esophageal reflux disease without esophagitis: Secondary | ICD-10-CM | POA: Diagnosis not present

## 2020-08-13 DIAGNOSIS — I10 Essential (primary) hypertension: Secondary | ICD-10-CM

## 2020-08-13 DIAGNOSIS — D75839 Thrombocytosis, unspecified: Secondary | ICD-10-CM

## 2020-08-13 MED ORDER — LOSARTAN POTASSIUM 25 MG PO TABS: 25.0000 mg | ORAL_TABLET | Freq: Every day | ORAL | 2 refills | Status: DC

## 2020-08-13 MED ORDER — HYDROCHLOROTHIAZIDE 25 MG PO TABS: 25.0000 mg | ORAL_TABLET | Freq: Every day | ORAL | 2 refills | Status: DC

## 2020-08-13 NOTE — Progress Notes (Signed)
Patient ID: Kylie Diaz, female   DOB: 24-Aug-1945, 75 y.o.   MRN: [id number]   Subjective:    Patient ID: Kylie Diaz, female    DOB: 1945/10/16, 75 y.o.   MRN: [id number]  HPI This visit occurred during the SARS-CoV-2 public health emergency.  Safety protocols were in place, including screening questions prior to the visit, additional usage of staff PPE, and extensive cleaning of exam room while observing appropriate contact time as indicated for disinfecting solutions.  Patient here for a scheduled follow up.  Here to follow up regarding her blood pressure.  She reports she is doing well.  Tries to stay active.  No chest pain reported.  No sob reported.  No increased cough or congestion.  No abdominal pain.  Bowels moving.  Walking.  Handling stress.    Past Medical History:  Diagnosis Date  . BMI 34.0-34.9,adult 04/11/2013  . Hypertension    Past Surgical History:  Procedure Laterality Date  . ABDOMINAL HYSTERECTOMY    . VAGINAL DELIVERY  2   Family History  Problem Relation Age of Onset  . Cancer Mother        unsure type  . Dementia Mother   . Aneurysm Sister   . Cancer Sister   . Cancer Brother        STOMACH  . Diabetes Brother   . Diabetes Sister   . Heart disease Sister    Social History   Socioeconomic History  . Marital status: Married    Spouse name: Not on file  . Number of children: Not on file  . Years of education: Not on file  . Highest education level: Not on file  Occupational History  . Not on file  Tobacco Use  . Smoking status: Former Smoker    Types: Cigarettes    Quit date: 11/03/1978    Years since quitting: 41.8  . Smokeless tobacco: Never Used  . Tobacco comment: 1980  Substance and Sexual Activity  . Alcohol use: Yes    Comment: social  . Drug use: No  . Sexual activity: Not Currently    Partners: Male    Birth control/protection: Surgical    Comment: hysterectomy.  Other Topics Concern  . Not on file  Social History  Narrative   Lives in [place].      School - BA in [employer]      Work - Oceanographer   Social Determinants of Radio broadcast assistant Strain: Not on [employer] Insecurity: Not on file  Transportation Needs: Not on file  Physical Activity: Not on file  Stress: Not on file  Social Connections: Not on file    Outpatient Encounter Medications as of 08/13/2020  Medication Sig  . Calcium Carbonate-Vitamin D (CALCIUM-VITAMIN D) 500-200 MG-UNIT per tablet Take 2 tablets by mouth 3 (three) times daily with meals.  . COLLAGEN PO Take by mouth.  . Omega-3 Fatty Acids (FISH OIL) 1000 MG CAPS Take 1 capsule by mouth daily.  . vitamin E 100 UNIT capsule Take 100 Units by mouth daily.  . [DISCONTINUED] hydrochlorothiazide (HYDRODIURIL) 25 MG tablet Take 1 tablet (25 mg total) by mouth daily.  . [DISCONTINUED] losartan (COZAAR) 25 MG tablet Take 1 tablet (25 mg total) by mouth daily.  . hydrochlorothiazide (HYDRODIURIL) 25 MG tablet Take 1 tablet (25 mg total) by mouth daily.  Marland Kitchen losartan (COZAAR) 25 MG tablet Take 1 tablet (25 mg total) by mouth daily.  . [  DISCONTINUED] pantoprazole (PROTONIX) 20 MG tablet Take 1 tablet (20 mg total) by mouth daily. Take 30 minutes before your evening meal (Patient not taking: Reported on 08/13/2020)  . [DISCONTINUED] potassium chloride (KLOR-CON) 10 MEQ tablet TAKE 1 TABLET(10 MEQ) BY MOUTH DAILY (Patient not taking: Reported on 08/13/2020)   No facility-administered encounter medications on file as of 08/13/2020.    Review of Systems  Constitutional: Negative for appetite change and unexpected weight change.  HENT: Negative for congestion and sinus pressure.   Respiratory: Negative for cough, chest tightness and shortness of breath.   Cardiovascular: Negative for chest pain, palpitations and leg swelling.  Gastrointestinal: Negative for abdominal pain, diarrhea, nausea and vomiting.  Genitourinary: Negative for difficulty urinating and  dysuria.  Musculoskeletal: Negative for joint swelling and myalgias.  Skin: Negative for color change and rash.  Neurological: Negative for dizziness, light-headedness and headaches.  Psychiatric/Behavioral: Negative for agitation and dysphoric mood.       Objective:    Physical Exam Vitals reviewed.  Constitutional:      General: She is not in acute distress.    Appearance: Normal appearance.  HENT:     Head: Normocephalic and atraumatic.     Right Ear: External ear normal.     Left Ear: External ear normal.  Eyes:     General: No scleral icterus.       Right eye: No discharge.        Left eye: No discharge.     Conjunctiva/sclera: Conjunctivae normal.  Neck:     Thyroid: No thyromegaly.  Cardiovascular:     Rate and Rhythm: Normal rate and regular rhythm.  Pulmonary:     Effort: No respiratory distress.     Breath sounds: Normal breath sounds. No wheezing.  Abdominal:     General: Bowel sounds are normal.     Palpations: Abdomen is soft.     Tenderness: There is no abdominal tenderness.  Musculoskeletal:        General: No swelling or tenderness.     Cervical back: Neck supple.  Lymphadenopathy:     Cervical: No cervical adenopathy.  Skin:    Findings: No erythema or rash.  Neurological:     Mental Status: She is alert.  Psychiatric:        Mood and Affect: Mood normal.        Behavior: Behavior normal.     BP 122/80   Pulse 80   Temp 98.2 F (36.8 C) (Oral)   Resp 16   Ht 5\' 6"  (1.676 m)   Wt 225 lb (102.1 kg)   SpO2 98%   BMI 36.32 kg/m  Wt Readings from Last 3 Encounters:  08/13/20 225 lb (102.1 kg)  04/11/20 229 lb 2 oz (103.9 kg)  08/25/19 227 lb 3.2 oz (103.1 kg)     Lab Results  Component Value Date   WBC 8.1 04/18/2020   HGB 11.3 (L) 04/18/2020   HCT 34.6 (L) 04/18/2020   PLT 388.0 04/18/2020   GLUCOSE 96 04/18/2020   CHOL 168 04/18/2020   TRIG 86.0 04/18/2020   HDL 58.20 04/18/2020   LDLCALC 92 04/18/2020   ALT 12 04/18/2020    AST 13 04/18/2020   NA 141 04/18/2020   K 4.0 04/18/2020   CL 104 04/18/2020   CREATININE 0.81 04/18/2020   BUN 13 04/18/2020   CO2 31 04/18/2020   TSH 0.95 04/18/2020   HGBA1C 5.4 04/18/2020   MICROALBUR 0.7 05/01/2015    MM  3D SCREEN BREAST BILATERAL  Result Date: 07/09/2020 CLINICAL DATA:  Screening. EXAM: DIGITAL SCREENING BILATERAL MAMMOGRAM WITH TOMOSYNTHESIS AND CAD TECHNIQUE: Bilateral screening digital craniocaudal and mediolateral oblique mammograms were obtained. Bilateral screening digital breast tomosynthesis was performed. The images were evaluated with computer-aided detection. COMPARISON:  Previous exam(s). ACR Breast Density Category b: There are scattered areas of fibroglandular density. FINDINGS: There are no findings suspicious for malignancy. The images were evaluated with computer-aided detection. IMPRESSION: No mammographic evidence of malignancy. A result letter of this screening mammogram will be mailed directly to the patient. RECOMMENDATION: Screening mammogram in one year. (Code:SM-B-01Y) BI-RADS CATEGORY  1: Negative. Electronically Signed   By: Margarette Canada M.D.   On: 07/09/2020 11:24       Assessment & Plan:   Problem List Items Addressed This Visit    Anemia    Follow cbc.       Relevant Orders   CBC with Differential/Platelet   Ferritin   Esophageal reflux    No upper symptoms reported.  On protonix.        Hypertension    Continue losartan and hctz.  Blood pressure doing well.  Follow pressures.  Follow metabolic panel.       Relevant Medications   hydrochlorothiazide (HYDRODIURIL) 25 MG tablet   losartan (COZAAR) 25 MG tablet   Other Relevant Orders   Hepatic function panel   Lipid panel   Basic metabolic panel   Thrombocytosis    Follow cbc.           Einar Pheasant, MD

## 2020-08-18 ENCOUNTER — Encounter: Payer: Self-pay | Admitting: Internal Medicine

## 2020-08-18 NOTE — Assessment & Plan Note (Signed)
Follow cbc.  

## 2020-08-18 NOTE — Assessment & Plan Note (Signed)
Continue losartan and hctz.  Blood pressure doing well.  Follow pressures.  Follow metabolic panel.

## 2020-08-18 NOTE — Assessment & Plan Note (Signed)
No upper symptoms reported.  On protonix.   

## 2020-08-27 ENCOUNTER — Telehealth: Payer: Self-pay | Admitting: Internal Medicine

## 2020-08-27 NOTE — Telephone Encounter (Signed)
Patient will call back and schedule Medicare Annual Wellness Visit (AWV).   Patient prefers to do it by telephone.   Last AWV:08/04/2019  Please schedule at anytime with Nurse Health Advisor.

## 2020-09-03 ENCOUNTER — Other Ambulatory Visit (INDEPENDENT_AMBULATORY_CARE_PROVIDER_SITE_OTHER): Payer: Medicare HMO

## 2020-09-03 ENCOUNTER — Other Ambulatory Visit: Payer: Self-pay

## 2020-09-03 DIAGNOSIS — D649 Anemia, unspecified: Secondary | ICD-10-CM | POA: Diagnosis not present

## 2020-09-03 DIAGNOSIS — I10 Essential (primary) hypertension: Secondary | ICD-10-CM | POA: Diagnosis not present

## 2020-09-03 LAB — CBC WITH DIFFERENTIAL/PLATELET
Basophils Absolute: 0 10*3/uL (ref 0.0–0.1)
Basophils Relative: 0.4 % (ref 0.0–3.0)
Eosinophils Absolute: 0.1 10*3/uL (ref 0.0–0.7)
Eosinophils Relative: 1.6 % (ref 0.0–5.0)
HCT: 34.6 % — ABNORMAL LOW (ref 36.0–46.0)
Hemoglobin: 11.2 g/dL — ABNORMAL LOW (ref 12.0–15.0)
Lymphocytes Relative: 24.3 % (ref 12.0–46.0)
Lymphs Abs: 1.6 10*3/uL (ref 0.7–4.0)
MCHC: 32.5 g/dL (ref 30.0–36.0)
MCV: 87.3 fl (ref 78.0–100.0)
Monocytes Absolute: 0.5 10*3/uL (ref 0.1–1.0)
Monocytes Relative: 7.6 % (ref 3.0–12.0)
Neutro Abs: 4.3 10*3/uL (ref 1.4–7.7)
Neutrophils Relative %: 66.1 % (ref 43.0–77.0)
Platelets: 376 10*3/uL (ref 150.0–400.0)
RBC: 3.96 Mil/uL (ref 3.87–5.11)
RDW: 14.3 % (ref 11.5–15.5)
WBC: 6.6 10*3/uL (ref 4.0–10.5)

## 2020-09-03 LAB — HEPATIC FUNCTION PANEL
ALT: 11 U/L (ref 0–35)
AST: 13 U/L (ref 0–37)
Albumin: 4.2 g/dL (ref 3.5–5.2)
Alkaline Phosphatase: 52 U/L (ref 39–117)
Bilirubin, Direct: 0.1 mg/dL (ref 0.0–0.3)
Total Bilirubin: 0.5 mg/dL (ref 0.2–1.2)
Total Protein: 6.6 g/dL (ref 6.0–8.3)

## 2020-09-03 LAB — LIPID PANEL
Cholesterol: 168 mg/dL (ref 0–200)
HDL: 56.6 mg/dL (ref >39.00)
LDL Cholesterol: 98 mg/dL (ref 0–99)
NonHDL: 111.56
Total CHOL/HDL Ratio: 3
Triglycerides: 69 mg/dL (ref 0.0–149.0)
VLDL: 13.8 mg/dL (ref 0.0–40.0)

## 2020-09-03 LAB — FERRITIN: Ferritin: 252.2 ng/mL (ref 10.0–291.0)

## 2020-09-03 LAB — BASIC METABOLIC PANEL
BUN: 11 mg/dL (ref 6–23)
CO2: 29 mEq/L (ref 19–32)
Calcium: 9.8 mg/dL (ref 8.4–10.5)
Chloride: 106 mEq/L (ref 96–112)
Creatinine, Ser: 0.78 mg/dL (ref 0.40–1.20)
GFR: 75.07 mL/min (ref >60.00)
Glucose, Bld: 84 mg/dL (ref 70–99)
Potassium: 4.3 mEq/L (ref 3.5–5.1)
Sodium: 142 mEq/L (ref 135–145)

## 2020-10-07 ENCOUNTER — Telehealth: Payer: Self-pay

## 2020-10-07 NOTE — Telephone Encounter (Signed)
Hydrochlorothiaizde last refilled on 08/13/20 and is set to end on 11/11/20 Losartan last refilled on 08/13/20 and is set to end on 11/11/20  Pt still has medication on the prescription and has 2 refills left. Attempted to call discuss with patient, left a message to call back.

## 2020-10-07 NOTE — Telephone Encounter (Signed)
Pt needs hydrochlorothiazide (HYDRODIURIL) 25 MG tablet and losartan sent to the [facility] on [address] and [address]. Pt states that she is completely out and would like that sent over today.

## 2020-10-09 NOTE — Telephone Encounter (Signed)
Patient is returning your call from earlier today.

## 2020-10-09 NOTE — Telephone Encounter (Signed)
left a message to call back.

## 2020-10-09 NOTE — Telephone Encounter (Signed)
Called and spoke to [place]. Darchelle states that the medication has already been sent to the pharmacy on Monday 10/07/20. Pts Hydrochlorothiazide still shows a order date of 08/13/20 but patient confirmed that it has already been done. Pt states that the issue was that [facility] and [facility] not use [facility] at [facility] time. She requests all of her local medicines be sent to [id number] on [address] and [address]. Updated the patients preferred pharmacy. She had no further questions or concerns.

## 2020-11-20 DIAGNOSIS — H35033 Hypertensive retinopathy, bilateral: Secondary | ICD-10-CM | POA: Diagnosis not present

## 2020-11-20 DIAGNOSIS — H524 Presbyopia: Secondary | ICD-10-CM | POA: Diagnosis not present

## 2020-12-03 DIAGNOSIS — Z0100 Encounter for examination of eyes and vision without abnormal findings: Secondary | ICD-10-CM | POA: Diagnosis not present

## 2020-12-09 ENCOUNTER — Telehealth: Payer: Self-pay | Admitting: Internal Medicine

## 2020-12-09 NOTE — Telephone Encounter (Signed)
Called patient. Unable to LM.  

## 2020-12-09 NOTE — Telephone Encounter (Signed)
Patient called in to cancel her 07-Jan-2023 appointment due to death in her family.Attempted to reschedule patient for the next in office opening but it is not until 12/23 and patient would like to be seen sooner due to not being seen since May 2022.Please advise.

## 2020-12-10 NOTE — Telephone Encounter (Signed)
Per appt notes, patient scheduled for Oct 4

## 2020-12-13 ENCOUNTER — Ambulatory Visit: Payer: Medicare HMO | Admitting: Internal Medicine

## 2020-12-17 ENCOUNTER — Encounter: Payer: Self-pay | Admitting: Internal Medicine

## 2020-12-17 ENCOUNTER — Telehealth: Payer: Self-pay | Admitting: Internal Medicine

## 2020-12-17 ENCOUNTER — Ambulatory Visit (INDEPENDENT_AMBULATORY_CARE_PROVIDER_SITE_OTHER): Payer: Medicare HMO | Admitting: Internal Medicine

## 2020-12-17 ENCOUNTER — Other Ambulatory Visit: Payer: Self-pay

## 2020-12-17 DIAGNOSIS — I10 Essential (primary) hypertension: Secondary | ICD-10-CM | POA: Diagnosis not present

## 2020-12-17 DIAGNOSIS — D649 Anemia, unspecified: Secondary | ICD-10-CM

## 2020-12-17 DIAGNOSIS — R739 Hyperglycemia, unspecified: Secondary | ICD-10-CM

## 2020-12-17 DIAGNOSIS — Z13220 Encounter for screening for lipoid disorders: Secondary | ICD-10-CM

## 2020-12-17 DIAGNOSIS — D75839 Thrombocytosis, unspecified: Secondary | ICD-10-CM | POA: Diagnosis not present

## 2020-12-17 DIAGNOSIS — F439 Reaction to severe stress, unspecified: Secondary | ICD-10-CM

## 2020-12-17 DIAGNOSIS — R69 Illness, unspecified: Secondary | ICD-10-CM | POA: Diagnosis not present

## 2020-12-17 NOTE — Telephone Encounter (Signed)
Patient scheduled for labs 01/07/21 as requested by check out note.   Needing orders placed.

## 2020-12-17 NOTE — Patient Instructions (Signed)
Decrease hydrochlorothiazide to 25mg  - 1/2 tablet per day.    Continue losartan 25mg  per day.

## 2020-12-17 NOTE — Progress Notes (Signed)
Patient ID: Kylie Diaz, female   DOB: 12-Mar-1946, 75 y.o.   MRN: [id number]   Subjective:    Patient ID: Kylie Diaz, female    DOB: 06/07/1945, 75 y.o.   MRN: [id number]  This visit occurred during the SARS-CoV-2 public health emergency.  Safety protocols were in place, including screening questions prior to the visit, additional usage of staff PPE, and extensive cleaning of exam room while observing appropriate contact time as indicated for disinfecting solutions.   Patient here for a scheduled follow up.    Chief Complaint  Patient presents with   Stress   Hypertension   .   HPI Increased stress.  Stress with her husband's health issues.  Also her brother recently passed.  Has good support.  Discussed.  Does not feel needs any further intervention at this time.  Discussed availability of grief counseling. Tries to stay active.  No chest pain.  Breathing stable.  No acid reflux reported.  No abdominal pain.  Bowels moving.  Has noticed blood pressure has been lower recently.  Discussed medications.     Past Medical History:  Diagnosis Date   BMI 34.0-34.9,adult 04/11/2013   Hypertension    Past Surgical History:  Procedure Laterality Date   ABDOMINAL HYSTERECTOMY     VAGINAL DELIVERY  2   Family History  Problem Relation Age of Onset   Cancer Mother        unsure type   Dementia Mother    Aneurysm Sister    Cancer Sister    Cancer Brother        STOMACH   Diabetes Brother    Diabetes Sister    Heart disease Sister    Social History   Socioeconomic History   Marital status: Married    Spouse name: Not on file   Number of children: Not on file   Years of education: Not on file   Highest education level: Not on file  Occupational History   Not on file  Tobacco Use   Smoking status: Former    Types: Cigarettes    Quit date: 11/03/1978    Years since quitting: 42.1   Smokeless tobacco: Never   Tobacco comments:    1980  Substance and Sexual Activity    Alcohol use: Yes    Comment: social   Drug use: No   Sexual activity: Not Currently    Partners: Male    Birth control/protection: Surgical    Comment: hysterectomy.  Other Topics Concern   Not on file  Social History Narrative   Lives in [place].      School - BA in [employer]      Work - Oceanographer   Social Determinants of Radio broadcast assistant Strain: Not on [employer] Insecurity: Not on file  Transportation Needs: Not on file  Physical Activity: Not on file  Stress: Not on file  Social Connections: Not on file     Review of Systems  Constitutional:  Negative for appetite change and unexpected weight change.  HENT:  Negative for congestion and sinus pressure.   Respiratory:  Negative for cough, chest tightness and shortness of breath.   Cardiovascular:  Negative for chest pain, palpitations and leg swelling.  Gastrointestinal:  Negative for abdominal pain, diarrhea, nausea and vomiting.  Genitourinary:  Negative for difficulty urinating and dysuria.  Musculoskeletal:  Negative for joint swelling and myalgias.  Skin:  Negative for color change and rash.  Neurological:  Negative for dizziness, light-headedness and headaches.  Psychiatric/Behavioral:  Negative for agitation and dysphoric mood.       Objective:     BP 112/70   Pulse 86   Temp 97.9 F (36.6 C)   Resp 16   Ht 5\' 6"  (1.676 m)   Wt 220 lb 6.4 oz (100 kg)   SpO2 99%   BMI 35.57 kg/m  Wt Readings from Last 3 Encounters:  12/17/20 220 lb 6.4 oz (100 kg)  08/13/20 225 lb (102.1 kg)  04/11/20 229 lb 2 oz (103.9 kg)    Physical Exam Vitals reviewed.  Constitutional:      General: She is not in acute distress.    Appearance: Normal appearance.  HENT:     Head: Normocephalic and atraumatic.     Right Ear: External ear normal.     Left Ear: External ear normal.  Eyes:     General: No scleral icterus.       Right eye: No discharge.        Left eye: No discharge.      Conjunctiva/sclera: Conjunctivae normal.  Neck:     Thyroid: No thyromegaly.  Cardiovascular:     Rate and Rhythm: Normal rate and regular rhythm.  Pulmonary:     Effort: No respiratory distress.     Breath sounds: Normal breath sounds. No wheezing.  Abdominal:     General: Bowel sounds are normal.     Palpations: Abdomen is soft.     Tenderness: There is no abdominal tenderness.  Musculoskeletal:        General: No swelling or tenderness.     Cervical back: Neck supple. No tenderness.  Lymphadenopathy:     Cervical: No cervical adenopathy.  Skin:    Findings: No erythema or rash.  Neurological:     Mental Status: She is alert.  Psychiatric:        Mood and Affect: Mood normal.        Behavior: Behavior normal.     Outpatient Encounter Medications as of 12/17/2020  Medication Sig   Calcium Carbonate-Vitamin D (CALCIUM-VITAMIN D) 500-200 MG-UNIT per tablet Take 2 tablets by mouth 3 (three) times daily with meals.   COLLAGEN PO Take by mouth.   hydrochlorothiazide (HYDRODIURIL) 25 MG tablet Take 1 tablet (25 mg total) by mouth daily.   losartan (COZAAR) 25 MG tablet Take 1 tablet (25 mg total) by mouth daily.   Omega-3 Fatty Acids (FISH OIL) 1000 MG CAPS Take 1 capsule by mouth daily.   vitamin E 100 UNIT capsule Take 100 Units by mouth daily.   No facility-administered encounter medications on file as of 12/17/2020.     Lab Results  Component Value Date   WBC 6.6 09/03/2020   HGB 11.2 (L) 09/03/2020   HCT 34.6 (L) 09/03/2020   PLT 376.0 09/03/2020   GLUCOSE 84 09/03/2020   CHOL 168 09/03/2020   TRIG 69.0 09/03/2020   HDL 56.60 09/03/2020   LDLCALC 98 09/03/2020   ALT 11 09/03/2020   AST 13 09/03/2020   NA 142 09/03/2020   K 4.3 09/03/2020   CL 106 09/03/2020   CREATININE 0.78 09/03/2020   BUN 11 09/03/2020   CO2 29 09/03/2020   TSH 0.95 04/18/2020   HGBA1C 5.4 04/18/2020   MICROALBUR 0.7 05/01/2015    MM 3D SCREEN BREAST BILATERAL  Result Date:  07/09/2020 CLINICAL DATA:  Screening. EXAM: DIGITAL SCREENING BILATERAL MAMMOGRAM WITH TOMOSYNTHESIS AND CAD TECHNIQUE: Bilateral screening  digital craniocaudal and mediolateral oblique mammograms were obtained. Bilateral screening digital breast tomosynthesis was performed. The images were evaluated with computer-aided detection. COMPARISON:  Previous exam(s). ACR Breast Density Category b: There are scattered areas of fibroglandular density. FINDINGS: There are no findings suspicious for malignancy. The images were evaluated with computer-aided detection. IMPRESSION: No mammographic evidence of malignancy. A result letter of this screening mammogram will be mailed directly to the patient. RECOMMENDATION: Screening mammogram in one year. (Code:SM-B-01Y) BI-RADS CATEGORY  1: Negative. Electronically Signed   By: Margarette Canada M.D.   On: 07/09/2020 11:24       Assessment & Plan:   Problem List Items Addressed This Visit     Anemia    Follow cbc.       Hypertension    Blood pressure as outlined.  Decrease hctz to 1/2 tablet q day.  Continue losartan.  Follow pressures.  Follow metabolic panel.       Stress    Increased stress. Discussed.  Will notify me if feels needs any further intervention.  Follow.       Thrombocytosis    Follow cbc.         Einar Pheasant, MD

## 2020-12-18 NOTE — Addendum Note (Signed)
Addended by: Lars Masson on: 12/18/2020 07:18 AM   Modules accepted: Orders

## 2020-12-18 NOTE — Telephone Encounter (Signed)
Future lab orders placed

## 2020-12-22 ENCOUNTER — Encounter: Payer: Self-pay | Admitting: Internal Medicine

## 2020-12-22 DIAGNOSIS — F439 Reaction to severe stress, unspecified: Secondary | ICD-10-CM | POA: Insufficient documentation

## 2020-12-22 NOTE — Assessment & Plan Note (Signed)
Follow cbc.  

## 2020-12-22 NOTE — Assessment & Plan Note (Signed)
Increased stress.  Discussed.  Will notify me if feels needs any further intervention.  Follow  

## 2020-12-22 NOTE — Assessment & Plan Note (Signed)
Blood pressure as outlined.  Decrease hctz to 1/2 tablet q day.  Continue losartan.  Follow pressures.  Follow metabolic panel.

## 2021-01-07 ENCOUNTER — Other Ambulatory Visit: Payer: Medicare HMO

## 2021-03-20 ENCOUNTER — Other Ambulatory Visit: Payer: Self-pay

## 2021-03-20 ENCOUNTER — Other Ambulatory Visit (INDEPENDENT_AMBULATORY_CARE_PROVIDER_SITE_OTHER): Payer: Medicare HMO

## 2021-03-20 DIAGNOSIS — I10 Essential (primary) hypertension: Secondary | ICD-10-CM | POA: Diagnosis not present

## 2021-03-20 DIAGNOSIS — D649 Anemia, unspecified: Secondary | ICD-10-CM

## 2021-03-20 DIAGNOSIS — R739 Hyperglycemia, unspecified: Secondary | ICD-10-CM | POA: Diagnosis not present

## 2021-03-20 DIAGNOSIS — Z13220 Encounter for screening for lipoid disorders: Secondary | ICD-10-CM

## 2021-03-20 LAB — BASIC METABOLIC PANEL
BUN: 14 mg/dL (ref 6–23)
CO2: 31 mEq/L (ref 19–32)
Calcium: 9.6 mg/dL (ref 8.4–10.5)
Chloride: 105 mEq/L (ref 96–112)
Creatinine, Ser: 0.79 mg/dL (ref 0.40–1.20)
GFR: 73.65 mL/min (ref >60.00)
Glucose, Bld: 87 mg/dL (ref 70–99)
Potassium: 4.3 mEq/L (ref 3.5–5.1)
Sodium: 140 mEq/L (ref 135–145)

## 2021-03-20 LAB — CBC WITH DIFFERENTIAL/PLATELET
Basophils Absolute: 0 10*3/uL (ref 0.0–0.1)
Basophils Relative: 0.5 % (ref 0.0–3.0)
Eosinophils Absolute: 0.1 10*3/uL (ref 0.0–0.7)
Eosinophils Relative: 1.5 % (ref 0.0–5.0)
HCT: 33.3 % — ABNORMAL LOW (ref 36.0–46.0)
Hemoglobin: 10.7 g/dL — ABNORMAL LOW (ref 12.0–15.0)
Lymphocytes Relative: 23.2 % (ref 12.0–46.0)
Lymphs Abs: 1.2 10*3/uL (ref 0.7–4.0)
MCHC: 32.2 g/dL (ref 30.0–36.0)
MCV: 86.7 fl (ref 78.0–100.0)
Monocytes Absolute: 0.5 10*3/uL (ref 0.1–1.0)
Monocytes Relative: 8.5 % (ref 3.0–12.0)
Neutro Abs: 3.5 10*3/uL (ref 1.4–7.7)
Neutrophils Relative %: 66.3 % (ref 43.0–77.0)
Platelets: 369 10*3/uL (ref 150.0–400.0)
RBC: 3.84 Mil/uL — ABNORMAL LOW (ref 3.87–5.11)
RDW: 13.9 % (ref 11.5–15.5)
WBC: 5.3 10*3/uL (ref 4.0–10.5)

## 2021-03-20 LAB — LIPID PANEL
Cholesterol: 180 mg/dL (ref 0–200)
HDL: 60.2 mg/dL (ref >39.00)
LDL Cholesterol: 101 mg/dL — ABNORMAL HIGH (ref 0–99)
NonHDL: 119.58
Total CHOL/HDL Ratio: 3
Triglycerides: 91 mg/dL (ref 0.0–149.0)
VLDL: 18.2 mg/dL (ref 0.0–40.0)

## 2021-03-20 LAB — HEPATIC FUNCTION PANEL
ALT: 13 U/L (ref 0–35)
AST: 13 U/L (ref 0–37)
Albumin: 3.9 g/dL (ref 3.5–5.2)
Alkaline Phosphatase: 61 U/L (ref 39–117)
Bilirubin, Direct: 0.1 mg/dL (ref 0.0–0.3)
Total Bilirubin: 0.5 mg/dL (ref 0.2–1.2)
Total Protein: 6.5 g/dL (ref 6.0–8.3)

## 2021-03-20 LAB — HEMOGLOBIN A1C: Hgb A1c MFr Bld: 5.4 % (ref 4.6–6.5)

## 2021-03-21 ENCOUNTER — Other Ambulatory Visit (INDEPENDENT_AMBULATORY_CARE_PROVIDER_SITE_OTHER): Payer: Medicare HMO

## 2021-03-21 ENCOUNTER — Other Ambulatory Visit: Payer: Self-pay | Admitting: Internal Medicine

## 2021-03-21 DIAGNOSIS — D649 Anemia, unspecified: Secondary | ICD-10-CM

## 2021-03-21 LAB — VITAMIN B12: Vitamin B-12: 250 pg/mL (ref 211–911)

## 2021-03-21 NOTE — Progress Notes (Signed)
Order placed for add on lab.  °

## 2021-03-27 ENCOUNTER — Other Ambulatory Visit: Payer: Self-pay

## 2021-03-27 ENCOUNTER — Ambulatory Visit (INDEPENDENT_AMBULATORY_CARE_PROVIDER_SITE_OTHER): Payer: Medicare HMO | Admitting: Internal Medicine

## 2021-03-27 DIAGNOSIS — D649 Anemia, unspecified: Secondary | ICD-10-CM

## 2021-03-27 DIAGNOSIS — K219 Gastro-esophageal reflux disease without esophagitis: Secondary | ICD-10-CM | POA: Diagnosis not present

## 2021-03-27 DIAGNOSIS — E538 Deficiency of other specified B group vitamins: Secondary | ICD-10-CM

## 2021-03-27 DIAGNOSIS — D75839 Thrombocytosis, unspecified: Secondary | ICD-10-CM

## 2021-03-27 DIAGNOSIS — F439 Reaction to severe stress, unspecified: Secondary | ICD-10-CM | POA: Diagnosis not present

## 2021-03-27 DIAGNOSIS — R69 Illness, unspecified: Secondary | ICD-10-CM | POA: Diagnosis not present

## 2021-03-27 DIAGNOSIS — I10 Essential (primary) hypertension: Secondary | ICD-10-CM | POA: Diagnosis not present

## 2021-03-27 NOTE — Patient Instructions (Signed)
Take vitamin B12 103mcg per day.

## 2021-03-27 NOTE — Progress Notes (Signed)
Patient ID: Roselyn Reef, female   DOB: [birth year], 76 y.o.   MRN: [id number]   Subjective:    Patient ID: Roselyn Reef, female    DOB: 03-28-1945, 77 y.o.   MRN: [id number]  This visit occurred during the SARS-CoV-2 public health emergency.  Safety protocols were in place, including screening questions prior to the visit, additional usage of staff PPE, and extensive cleaning of exam room while observing appropriate contact time as indicated for disinfecting solutions.   Patient here for a scheduled follow up.   Chief Complaint  Patient presents with   Stress   .   HPI Here to follow up regarding her blood pressure.  Increased stress.  Discussed.  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  Increased stress related to his underlying medical issues.  Does not feel she needs any further intervention at this time.  She did stop her blood pressure medication.  Reports she had looked up medications and states she found that losartan and hctz "can cause dementia".  Discussed with her.  Discussed the need to keep blood pressure under control.  No chest pain or sob reported.  No abdominal pain or bowel change reported.     Past Medical History:  Diagnosis Date   BMI 34.0-34.9,adult 04/11/2013   Hypertension    Past Surgical History:  Procedure Laterality Date   ABDOMINAL HYSTERECTOMY     VAGINAL DELIVERY  2   Family History  Problem Relation Age of Onset   Cancer Mother        unsure type   Dementia Mother    Aneurysm Sister    Cancer Sister    Cancer Brother        STOMACH   Diabetes Brother    Diabetes Sister    Heart disease Sister    Social History   Socioeconomic History   Marital status: Married    Spouse name: Not on file   Number of children: Not on file   Years of education: Not on file   Highest education level: Not on file  Occupational History   Not on file  Tobacco Use   Smoking status: Former    Types: Cigarettes    Quit date: 11/03/1978    Years since quitting: 42.4    Smokeless tobacco: Never   Tobacco comments:    1980  Substance and Sexual Activity   Alcohol use: Yes    Comment: social   Drug use: No   Sexual activity: Not Currently    Partners: Male    Birth control/protection: Surgical    Comment: hysterectomy.  Other Topics Concern   Not on file  Social History Narrative   Lives in [place].      School - BA in [employer]      Work - Oceanographer   Social Determinants of Radio broadcast assistant Strain: Not on [employer] Insecurity: Not on file  Transportation Needs: Not on file  Physical Activity: Not on file  Stress: Not on file  Social Connections: Not on file     Review of Systems  Constitutional:  Negative for appetite change and unexpected weight change.  HENT:  Negative for congestion and sinus pressure.   Respiratory:  Negative for cough, chest tightness and shortness of breath.   Cardiovascular:  Negative for chest pain, palpitations and leg swelling.  Gastrointestinal:  Negative for abdominal pain, diarrhea, nausea and vomiting.  Genitourinary:  Negative for difficulty urinating and dysuria.  Musculoskeletal:  Negative for joint swelling and myalgias.  Skin:  Negative for color change and rash.  Neurological:  Negative for dizziness, light-headedness and headaches.  Psychiatric/Behavioral:  Negative for agitation and dysphoric mood.       Objective:     BP 118/74    Pulse 96    Temp 97.9 F (36.6 C)    Resp 16    Ht 5\' 6"  (1.676 m)    Wt 223 lb 9.6 oz (101.4 kg)    SpO2 99%    BMI 36.09 kg/m  Wt Readings from Last 3 Encounters:  03/27/21 223 lb 9.6 oz (101.4 kg)  12/17/20 220 lb 6.4 oz (100 kg)  08/13/20 225 lb (102.1 kg)    Physical Exam Vitals reviewed.  Constitutional:      General: She is not in acute distress.    Appearance: Normal appearance.  HENT:     Head: Normocephalic and atraumatic.     Right Ear: External ear normal.     Left Ear: External ear normal.  Eyes:     General:  No scleral icterus.       Right eye: No discharge.        Left eye: No discharge.     Conjunctiva/sclera: Conjunctivae normal.  Neck:     Thyroid: No thyromegaly.  Cardiovascular:     Rate and Rhythm: Normal rate and regular rhythm.  Pulmonary:     Effort: No respiratory distress.     Breath sounds: Normal breath sounds. No wheezing.  Abdominal:     General: Bowel sounds are normal.     Palpations: Abdomen is soft.     Tenderness: There is no abdominal tenderness.  Musculoskeletal:        General: No swelling or tenderness.     Cervical back: Neck supple. No tenderness.  Lymphadenopathy:     Cervical: No cervical adenopathy.  Skin:    Findings: No erythema or rash.  Neurological:     Mental Status: She is alert.  Psychiatric:        Mood and Affect: Mood normal.        Behavior: Behavior normal.     Outpatient Encounter Medications as of 03/27/2021  Medication Sig   Calcium Carbonate-Vitamin D (CALCIUM-VITAMIN D) 500-200 MG-UNIT per tablet Take 2 tablets by mouth 3 (three) times daily with meals.   COLLAGEN PO Take by mouth.   Omega-3 Fatty Acids (FISH OIL) 1000 MG CAPS Take 1 capsule by mouth daily.   vitamin E 100 UNIT capsule Take 100 Units by mouth daily.   hydrochlorothiazide (HYDRODIURIL) 25 MG tablet Take 1 tablet (25 mg total) by mouth daily.   losartan (COZAAR) 25 MG tablet Take 1 tablet (25 mg total) by mouth daily.   No facility-administered encounter medications on file as of 03/27/2021.     Lab Results  Component Value Date   WBC 5.3 03/20/2021   HGB 10.7 (L) 03/20/2021   HCT 33.3 (L) 03/20/2021   PLT 369.0 03/20/2021   GLUCOSE 87 03/20/2021   CHOL 180 03/20/2021   TRIG 91.0 03/20/2021   HDL 60.20 03/20/2021   LDLCALC 101 (H) 03/20/2021   ALT 13 03/20/2021   AST 13 03/20/2021   NA 140 03/20/2021   K 4.3 03/20/2021   CL 105 03/20/2021   CREATININE 0.79 03/20/2021   BUN 14 03/20/2021   CO2 31 03/20/2021   TSH 0.95 04/18/2020   HGBA1C 5.4  03/20/2021   MICROALBUR 0.7 05/01/2015  MM 3D SCREEN BREAST BILATERAL  Result Date: 07/09/2020 CLINICAL DATA:  Screening. EXAM: DIGITAL SCREENING BILATERAL MAMMOGRAM WITH TOMOSYNTHESIS AND CAD TECHNIQUE: Bilateral screening digital craniocaudal and mediolateral oblique mammograms were obtained. Bilateral screening digital breast tomosynthesis was performed. The images were evaluated with computer-aided detection. COMPARISON:  Previous exam(s). ACR Breast Density Category b: There are scattered areas of fibroglandular density. FINDINGS: There are no findings suspicious for malignancy. The images were evaluated with computer-aided detection. IMPRESSION: No mammographic evidence of malignancy. A result letter of this screening mammogram will be mailed directly to the patient. RECOMMENDATION: Screening mammogram in one year. (Code:SM-B-01Y) BI-RADS CATEGORY  1: Negative. Electronically Signed   By: Margarette Canada M.D.   On: 07/09/2020 11:24       Assessment & Plan:   Problem List Items Addressed This Visit     Anemia    Follow cbc.       Relevant Orders   CBC with Differential/Platelet   RBC Folate   IBC + Ferritin   B12 deficiency    Restart oral B12.        Esophageal reflux    No upper symptoms reported.  On protonix.       Hypertension    Blood pressure as outlined. Blood pressure on my check 142/80. Discussed restarting hctz. She wants to hold on restarting blood pressure medication.   Follow pressures.  Follow metabolic panel. Get her back in soon to reassess.        Stress    Increased stress as outlined.  Discussed.  Will notify me if feels needs any further intervention.  Follow.       Thrombocytosis    Follow cbc.         Einar Pheasant, MD

## 2021-03-30 ENCOUNTER — Encounter: Payer: Self-pay | Admitting: Internal Medicine

## 2021-03-30 DIAGNOSIS — E538 Deficiency of other specified B group vitamins: Secondary | ICD-10-CM | POA: Insufficient documentation

## 2021-03-30 NOTE — Assessment & Plan Note (Signed)
Increased stress as outlined.  Discussed.  Will notify me if feels needs any further intervention.  Follow.  

## 2021-03-30 NOTE — Assessment & Plan Note (Signed)
Follow cbc.  

## 2021-03-30 NOTE — Assessment & Plan Note (Signed)
Blood pressure as outlined. Blood pressure on my check 142/80. Discussed restarting hctz. She wants to hold on restarting blood pressure medication.   Follow pressures.  Follow metabolic panel. Get her back in soon to reassess.

## 2021-03-30 NOTE — Assessment & Plan Note (Signed)
Restart oral B12.

## 2021-03-30 NOTE — Assessment & Plan Note (Signed)
No upper symptoms reported.  On protonix.   

## 2021-04-01 DIAGNOSIS — H34812 Central retinal vein occlusion, left eye, with macular edema: Secondary | ICD-10-CM | POA: Diagnosis not present

## 2021-04-04 DIAGNOSIS — H34812 Central retinal vein occlusion, left eye, with macular edema: Secondary | ICD-10-CM | POA: Diagnosis not present

## 2021-04-11 DIAGNOSIS — H02212 Cicatricial lagophthalmos right lower eyelid: Secondary | ICD-10-CM | POA: Diagnosis not present

## 2021-04-22 ENCOUNTER — Ambulatory Visit (INDEPENDENT_AMBULATORY_CARE_PROVIDER_SITE_OTHER): Payer: Medicare HMO

## 2021-04-22 VITALS — Ht 66.0 in | Wt 223.0 lb

## 2021-04-22 DIAGNOSIS — Z0000 Encounter for general adult medical examination without abnormal findings: Secondary | ICD-10-CM

## 2021-04-22 NOTE — Patient Instructions (Addendum)
[name] , Thank you for taking time to come for your Medicare Wellness Visit. I appreciate your ongoing commitment to your health goals. Please review the following plan we discussed and let me know if I can assist you in the future.   These are the goals we discussed:  Goals       Patient Stated     Increase physical activity (pt-stated)      I would like to exercise at the gym         This is a list of the screening recommended for you and due dates:  Health Maintenance  Topic Date Due   COVID-19 Vaccine (4 - Booster for Pfizer series) 05/08/2021*   Zoster (Shingles) Vaccine (1 of 2) 06/25/2021*   Pneumonia Vaccine (1 - PCV) 03/27/2022*   DEXA scan (bone density measurement)  04/22/2022*   Tetanus Vaccine  04/22/2022*   Mammogram  07/09/2022   Colon Cancer Screening  08/10/2029   Hepatitis C Screening: USPSTF Recommendation to screen - Ages 18-79 yo.  Completed   HPV Vaccine  Aged Out   Flu Shot  Discontinued  *Topic was postponed. The date shown is not the original due date.    Advanced directives: not yet completed  Conditions/risks identified: none new  Next appointment: Follow up in one year for your annual wellness visit    Preventive Care 65 Years and Older, Female Preventive care refers to lifestyle choices and visits with your health care provider that can promote health and wellness. What does preventive care include? A yearly physical exam. This is also called an annual well check. Dental exams once or twice a year. Routine eye exams. Ask your health care provider how often you should have your eyes checked. Personal lifestyle choices, including: Daily care of your teeth and gums. Regular physical activity. Eating a healthy diet. Avoiding tobacco and drug use. Limiting alcohol use. Practicing safe sex. Taking low-dose aspirin every day. Taking vitamin and mineral supplements as recommended by your health care provider. What happens during an  annual well check? The services and screenings done by your health care provider during your annual well check will depend on your age, overall health, lifestyle risk factors, and family history of disease. Counseling  Your health care provider may ask you questions about your: Alcohol use. Tobacco use. Drug use. Emotional well-being. Home and relationship well-being. Sexual activity. Eating habits. History of falls. Memory and ability to understand (cognition). Work and work Statistician. Reproductive health. Screening  You may have the following tests or measurements: Height, weight, and BMI. Blood pressure. Lipid and cholesterol levels. These may be checked every 5 years, or more frequently if you are over 21 years old. Skin check. Lung cancer screening. You may have this screening every year starting at age 87 if you have a 30-pack-year history of smoking and currently smoke or have quit within the past 15 years. Fecal occult blood test (FOBT) of the stool. You may have this test every year starting at age 61. Flexible sigmoidoscopy or colonoscopy. You may have a sigmoidoscopy every 5 years or a colonoscopy every 10 years starting at age 75. Hepatitis C blood test. Hepatitis B blood test. Sexually transmitted disease (STD) testing. Diabetes screening. This is done by checking your blood sugar (glucose) after you have not eaten for a while (fasting). You may have this done every 1-3 years. Bone density scan. This is done to screen for osteoporosis. You may have this done starting at age  65. Mammogram. This may be done every 1-2 years. Talk to your health care provider about how often you should have regular mammograms. Talk with your health care provider about your test results, treatment options, and if necessary, the need for more tests. Vaccines  Your health care provider may recommend certain vaccines, such as: Influenza vaccine. This is recommended every year. Tetanus,  diphtheria, and acellular pertussis (Tdap, Td) vaccine. You may need a Td booster every 10 years. Zoster vaccine. You may need this after age 15. Pneumococcal 13-valent conjugate (PCV13) vaccine. One dose is recommended after age 67. Pneumococcal polysaccharide (PPSV23) vaccine. One dose is recommended after age 36. Talk to your health care provider about which screenings and vaccines you need and how often you need them. This information is not intended to replace advice given to you by your health care provider. Make sure you discuss any questions you have with your health care provider. Document Released: 03/29/2015 Document Revised: 11/20/2015 Document Reviewed: 01/01/2015 Elsevier Interactive Patient Education  2017 [employer] Prevention in the Home Falls can cause injuries. They can happen to people of all ages. There are many things you can do to make your home safe and to help prevent falls. What can I do on the outside of my home? Regularly fix the edges of walkways and driveways and fix any cracks. Remove anything that might make you trip as you walk through a door, such as a raised step or threshold. Trim any bushes or trees on the path to your home. Use bright outdoor lighting. Clear any walking paths of anything that might make someone trip, such as rocks or tools. Regularly check to see if handrails are loose or broken. Make sure that both sides of any steps have handrails. Any raised decks and porches should have guardrails on the edges. Have any leaves, snow, or ice cleared regularly. Use sand or salt on walking paths during winter. Clean up any spills in your garage right away. This includes oil or grease spills. What can I do in the bathroom? Use night lights. Install grab bars by the toilet and in the tub and shower. Do not use towel bars as grab bars. Use non-skid mats or decals in the tub or shower. If you need to sit down in the shower, use a plastic,  non-slip stool. Keep the floor dry. Clean up any water that spills on the floor as soon as it happens. Remove soap buildup in the tub or shower regularly. Attach bath mats securely with double-sided non-slip rug tape. Do not have throw rugs and other things on the floor that can make you trip. What can I do in the bedroom? Use night lights. Make sure that you have a light by your bed that is easy to reach. Do not use any sheets or blankets that are too big for your bed. They should not hang down onto the floor. Have a firm chair that has side arms. You can use this for support while you get dressed. Do not have throw rugs and other things on the floor that can make you trip. What can I do in the kitchen? Clean up any spills right away. Avoid walking on wet floors. Keep items that you use a lot in easy-to-reach places. If you need to reach something above you, use a strong step stool that has a grab bar. Keep electrical cords out of the way. Do not use floor polish or wax that makes floors slippery.  If you must use wax, use non-skid floor wax. Do not have throw rugs and other things on the floor that can make you trip. What can I do with my stairs? Do not leave any items on the stairs. Make sure that there are handrails on both sides of the stairs and use them. Fix handrails that are broken or loose. Make sure that handrails are as long as the stairways. Check any carpeting to make sure that it is firmly attached to the stairs. Fix any carpet that is loose or worn. Avoid having throw rugs at the top or bottom of the stairs. If you do have throw rugs, attach them to the floor with carpet tape. Make sure that you have a light switch at the top of the stairs and the bottom of the stairs. If you do not have them, ask someone to add them for you. What else can I do to help prevent falls? Wear shoes that: Do not have high heels. Have rubber bottoms. Are comfortable and fit you well. Are closed  at the toe. Do not wear sandals. If you use a stepladder: Make sure that it is fully opened. Do not climb a closed stepladder. Make sure that both sides of the stepladder are locked into place. Ask someone to hold it for you, if possible. Clearly mark and make sure that you can see: Any grab bars or handrails. First and last steps. Where the edge of each step is. Use tools that help you move around (mobility aids) if they are needed. These include: Canes. Walkers. Scooters. Crutches. Turn on the lights when you go into a dark area. Replace any light bulbs as soon as they burn out. Set up your furniture so you have a clear path. Avoid moving your furniture around. If any of your floors are uneven, fix them. If there are any pets around you, be aware of where they are. Review your medicines with your doctor. Some medicines can make you feel dizzy. This can increase your chance of falling. Ask your doctor what other things that you can do to help prevent falls. This information is not intended to replace advice given to you by your health care provider. Make sure you discuss any questions you have with your health care provider. Document Released: 12/27/2008 Document Revised: 08/08/2015 Document Reviewed: 04/06/2014 Elsevier Interactive Patient Education  2017 [employer].

## 2021-04-22 NOTE — Progress Notes (Signed)
Subjective:   Kylie Diaz is a 76 y.o. female who presents for Medicare Annual (Subsequent) preventive examination.  Review of Systems    No ROS.  Medicare Wellness Virtual Visit.  Visual/audio telehealth visit, UTA vital signs.   See social history for additional risk factors.   Cardiac Risk Factors include: advanced age (>39men, >61 women);hypertension     Objective:    Today's Vitals   04/22/21 1006  Weight: 223 lb (101.2 kg)  Height: 5\' 6"  (1.676 m)   Body mass index is 35.99 kg/m.  Advanced Directives 04/22/2021 08/04/2019 03/27/2016  Does Patient Have a Medical Advance Directive? No No No  Would patient like information on creating a medical advance directive? No - Patient declined Yes (MAU/Ambulatory/Procedural Areas - Information given) Yes (MAU/Ambulatory/Procedural Areas - Information given)    Current Medications (verified) Outpatient Encounter Medications as of 04/22/2021  Medication Sig   Calcium Carbonate-Vitamin D (CALCIUM-VITAMIN D) 500-200 MG-UNIT per tablet Take 2 tablets by mouth 3 (three) times daily with meals.   COLLAGEN PO Take by mouth.   hydrochlorothiazide (HYDRODIURIL) 25 MG tablet Take 1 tablet (25 mg total) by mouth daily.   losartan (COZAAR) 25 MG tablet Take 1 tablet (25 mg total) by mouth daily.   Omega-3 Fatty Acids (FISH OIL) 1000 MG CAPS Take 1 capsule by mouth daily.   vitamin E 100 UNIT capsule Take 100 Units by mouth daily.   No facility-administered encounter medications on file as of 04/22/2021.    Allergies (verified) Lisinopril   History: Past Medical History:  Diagnosis Date   BMI 34.0-34.9,adult 04/11/2013   Hypertension    Past Surgical History:  Procedure Laterality Date   ABDOMINAL HYSTERECTOMY     VAGINAL DELIVERY  2   Family History  Problem Relation Age of Onset   Cancer Mother        unsure type   Dementia Mother    Aneurysm Sister    Cancer Sister    Cancer Brother        STOMACH   Diabetes Brother     Diabetes Sister    Heart disease Sister    Social History   Socioeconomic History   Marital status: Married    Spouse name: Not on file   Number of children: Not on file   Years of education: Not on file   Highest education level: Not on file  Occupational History   Not on file  Tobacco Use   Smoking status: Former    Types: Cigarettes    Quit date: 11/03/1978    Years since quitting: 42.4   Smokeless tobacco: Never   Tobacco comments:    1980  Substance and Sexual Activity   Alcohol use: Yes    Comment: social   Drug use: No   Sexual activity: Not Currently    Partners: Male    Birth control/protection: Surgical    Comment: hysterectomy.  Other Topics Concern   Not on file  Social History Narrative   Lives in [place].      School - BA in [employer]      Work - Oceanographer   Social Determinants of Radio broadcast assistant Strain: Low Risk    Difficulty of Paying Living Expenses: Not hard at all  Food Insecurity: No Food Insecurity   Worried About Charity fundraiser in the Last Year: Never true   Arboriculturist in the Last Year: Never true  Transportation  Needs: No Transportation Needs   Lack of Transportation (Medical): No   Lack of Transportation (Non-Medical): No  Physical Activity: Not on file  Stress: No Stress Concern Present   Feeling of Stress : Not at all  Social Connections: Unknown   Frequency of Communication with Friends and Family: More than three times a week   Frequency of Social Gatherings with Friends and Family: More than three times a week   Attends Religious Services: More than 4 times per year   Active Member of [employer] or Organizations: Not on file   Attends Archivist Meetings: Not on file   Marital Status: Married    Tobacco Counseling Counseling given: Not Answered Tobacco comments: 1980   Clinical Intake:  Pre-visit preparation completed: Yes        Diabetes: No  How often do you need to  have someone help you when you read instructions, pamphlets, or other written materials from your doctor or pharmacy?: 1 - Never    Interpreter Needed?: No      Activities of Daily Living In your present state of health, do you have any difficulty performing the following activities: 04/22/2021  Hearing? N  Vision? N  Difficulty concentrating or making decisions? N  Walking or climbing stairs? N  Dressing or bathing? N  Doing errands, shopping? N  Preparing Food and eating ? N  Using the Toilet? N  In the past six months, have you accidently leaked urine? N  Comment Managed with pad as needed  Do you have problems with loss of bowel control? N  Managing your Medications? N  Managing your Finances? N  Housekeeping or managing your Housekeeping? N  Some recent data might be hidden    Patient Care Team: Einar Pheasant, MD as PCP - General (Internal Medicine)  Indicate any recent Medical Services you may have received from other than [facility] providers in the past year (date may be approximate).     Assessment:   This is a routine wellness examination for Kylie Diaz.  Virtual Visit via Telephone Note  I connected with  Kylie Diaz on 04/22/21 at  9:45 AM EST by telephone and verified that I am speaking with the correct person using two identifiers.  Persons participating in the virtual visit: patient/Nurse Health Advisor   I discussed the limitations, risks, security and privacy concerns of performing an evaluation and management service by telephone and the availability of in person appointments. The patient expressed understanding and agreed to proceed.  Interactive audio and video telecommunications were attempted between this nurse and patient, however failed, due to patient having technical difficulties OR patient did not have access to video capability.  We continued and completed visit with audio only.  Some vital signs may be absent or patient reported.    Hearing/Vision screen Hearing Screening - Comments:: Patient is able to hear conversational tones without difficulty. No issues reported. Vision Screening - Comments:: They have seen their ophthalmologist in the last 12 months.  Followed by [facility] Dr. Isaias Sakai and Dr. Yvetta Coder  Dietary issues and exercise activities discussed: Healthy diet  Good water intake Current Exercise Habits: Home exercise routine, Intensity: Mild   Goals Addressed               This Visit's Progress     Patient Stated     Increase physical activity (pt-stated)        I would like to exercise at the gym  Depression Screen PHQ 2/9 Scores 04/22/2021 08/13/2020 08/04/2019 04/21/2019 12/13/2018 03/27/2016 09/19/2015  PHQ - 2 Score 0 0 0 4 - 2 0  PHQ- 9 Score - - - 4 - - -  Exception Documentation - - - - Patient refusal - -    Fall Risk Fall Risk  04/22/2021 04/11/2020 08/04/2019 12/13/2018 03/27/2016  Falls in the past year? 0 1 0 0 No  Number falls in past yr: 0 0 0 - -  Injury with Fall? - 0 - - -  Follow up Falls evaluation completed Falls evaluation completed Falls evaluation completed Falls evaluation completed -   FALL RISK PREVENTION PERTAINING TO THE HOME: Home free of loose throw rugs in walkways, pet beds, electrical cords, etc? Yes  Adequate lighting in your home to reduce risk of falls? Yes   ASSISTIVE DEVICES UTILIZED TO PREVENT FALLS: Use of a cane, walker or w/c? No   TIMED UP AND GO: Was the test performed? No .   Cognitive Function:  Patient is alert and oriented x3.    6CIT Screen 08/04/2019 03/27/2016  What Year? 0 points 0 points  What month? 0 points 0 points  What time? 0 points 0 points  Count back from 20 0 points 0 points  Months in reverse 0 points 0 points  Repeat phrase 0 points -  Total Score 0 -   Immunizations Immunization History  Administered Date(s) Administered   PFIZER(Purple Top)SARS-COV-2 Vaccination 04/28/2019, 05/23/2019,  12/11/2019   Tdap 03/22/2009   TDAP status: Due, Education has been provided regarding the importance of this vaccine. Advised may receive this vaccine at local pharmacy or Health Dept. Aware to provide a copy of the vaccination record if obtained from local pharmacy or Health Dept. Verbalized acceptance and understanding. Deferred.   Screening Tests Health Maintenance  Topic Date Due   COVID-19 Vaccine (4 - Booster for Pfizer series) 05/08/2021 (Originally 02/05/2020)   Zoster Vaccines- Shingrix (1 of 2) 06/25/2021 (Originally 09/21/1965)   Pneumonia Vaccine 87+ Years old (1 - PCV) 03/27/2022 (Originally [birth year]/1952)   DEXA SCAN  04/22/2022 (Originally 09/22/2011)   TETANUS/TDAP  04/22/2022 (Originally 03/23/2019)   MAMMOGRAM  07/09/2022   COLONOSCOPY (Pts 45-49yrs Insurance coverage will need to be confirmed)  08/10/2029   Hepatitis C Screening  Completed   HPV VACCINES  Aged Out   INFLUENZA VACCINE  Discontinued   Health Maintenance There are no preventive care reminders to display for this patient.  Lung Cancer Screening: (Low Dose CT Chest recommended if Age 28-80 years, 30 pack-year currently smoking OR have quit w/in 15years.) does not qualify.   Vision Screening: Recommended annual ophthalmology exams for early detection of glaucoma and other disorders of the eye.  Dental Screening: Recommended annual dental exams for proper oral hygiene  Community Resource Referral / Chronic Care Management: CRR required this visit?  No   CCM required this visit?  No      Plan:   Keep all routine maintenance appointments.   I have personally reviewed and noted the following in the patients chart:   Medical and social history Use of alcohol, tobacco or illicit drugs  Current medications and supplements including opioid prescriptions.  Functional ability and status Nutritional status Physical activity Advanced directives List of other physicians Hospitalizations, surgeries, and ER  visits in previous 12 months Vitals Screenings to include cognitive, depression, and falls Referrals and appointments  In addition, I have reviewed and discussed with patient certain preventive protocols, quality metrics, and best  practice recommendations. A written personalized care plan for preventive services as well as general preventive health recommendations were provided to patient via mychart.     Varney Biles, LPN   03/24/3788

## 2021-05-06 DIAGNOSIS — H34812 Central retinal vein occlusion, left eye, with macular edema: Secondary | ICD-10-CM | POA: Diagnosis not present

## 2021-05-22 ENCOUNTER — Other Ambulatory Visit: Payer: Medicare HMO

## 2021-05-26 ENCOUNTER — Ambulatory Visit: Payer: Medicare HMO | Admitting: Internal Medicine

## 2021-06-06 ENCOUNTER — Other Ambulatory Visit: Payer: Self-pay

## 2021-06-06 ENCOUNTER — Other Ambulatory Visit (INDEPENDENT_AMBULATORY_CARE_PROVIDER_SITE_OTHER): Payer: Medicare HMO

## 2021-06-06 DIAGNOSIS — D649 Anemia, unspecified: Secondary | ICD-10-CM

## 2021-06-06 LAB — CBC WITH DIFFERENTIAL/PLATELET
Basophils Absolute: 0 10*3/uL (ref 0.0–0.1)
Basophils Relative: 0.5 % (ref 0.0–3.0)
Eosinophils Absolute: 0.1 10*3/uL (ref 0.0–0.7)
Eosinophils Relative: 1.1 % (ref 0.0–5.0)
HCT: 34.8 % — ABNORMAL LOW (ref 36.0–46.0)
Hemoglobin: 11.1 g/dL — ABNORMAL LOW (ref 12.0–15.0)
Lymphocytes Relative: 23.1 % (ref 12.0–46.0)
Lymphs Abs: 1.5 10*3/uL (ref 0.7–4.0)
MCHC: 31.8 g/dL (ref 30.0–36.0)
MCV: 87.9 fl (ref 78.0–100.0)
Monocytes Absolute: 0.5 10*3/uL (ref 0.1–1.0)
Monocytes Relative: 7.1 % (ref 3.0–12.0)
Neutro Abs: 4.4 10*3/uL (ref 1.4–7.7)
Neutrophils Relative %: 68.2 % (ref 43.0–77.0)
Platelets: 330 10*3/uL (ref 150.0–400.0)
RBC: 3.96 Mil/uL (ref 3.87–5.11)
RDW: 14.5 % (ref 11.5–15.5)
WBC: 6.5 10*3/uL (ref 4.0–10.5)

## 2021-06-10 DIAGNOSIS — H34812 Central retinal vein occlusion, left eye, with macular edema: Secondary | ICD-10-CM | POA: Diagnosis not present

## 2021-06-10 LAB — IBC + FERRITIN
Ferritin: 178.8 ng/mL (ref 10.0–291.0)
Iron: 64 ug/dL (ref 42–145)
Saturation Ratios: 20.1 % (ref 20.0–50.0)
TIBC: 317.8 ug/dL (ref 250.0–450.0)
Transferrin: 227 mg/dL (ref 212.0–360.0)

## 2021-06-10 LAB — FOLATE RBC: RBC Folate: 870 ng/mL RBC (ref >280)

## 2021-06-11 ENCOUNTER — Encounter: Payer: Self-pay | Admitting: Internal Medicine

## 2021-06-11 ENCOUNTER — Ambulatory Visit (INDEPENDENT_AMBULATORY_CARE_PROVIDER_SITE_OTHER): Payer: Medicare HMO | Admitting: Internal Medicine

## 2021-06-11 DIAGNOSIS — F439 Reaction to severe stress, unspecified: Secondary | ICD-10-CM

## 2021-06-11 DIAGNOSIS — R739 Hyperglycemia, unspecified: Secondary | ICD-10-CM

## 2021-06-11 DIAGNOSIS — I10 Essential (primary) hypertension: Secondary | ICD-10-CM | POA: Diagnosis not present

## 2021-06-11 DIAGNOSIS — D649 Anemia, unspecified: Secondary | ICD-10-CM

## 2021-06-11 DIAGNOSIS — K219 Gastro-esophageal reflux disease without esophagitis: Secondary | ICD-10-CM | POA: Diagnosis not present

## 2021-06-11 DIAGNOSIS — R69 Illness, unspecified: Secondary | ICD-10-CM | POA: Diagnosis not present

## 2021-06-11 MED ORDER — LOSARTAN POTASSIUM 25 MG PO TABS: 25.0000 mg | ORAL_TABLET | Freq: Every day | ORAL | 2 refills | Status: DC

## 2021-06-11 MED ORDER — HYDROCHLOROTHIAZIDE 25 MG PO TABS: 25.0000 mg | ORAL_TABLET | Freq: Every day | ORAL | 2 refills | Status: DC

## 2021-06-11 NOTE — Progress Notes (Signed)
Patient ID: Kylie Diaz, female   DOB: 07/19/1945, 76 y.o.   MRN: [id number] ? ? ?Subjective:  ? ? Patient ID: Kylie Diaz, female    DOB: 03-Jul-1945, 76 y.o.   MRN: [id number] ? ?This visit occurred during the SARS-CoV-2 public health emergency.  Safety protocols were in place, including screening questions prior to the visit, additional usage of staff PPE, and extensive cleaning of exam room while observing appropriate contact time as indicated for disinfecting solutions.  ? ?Patient here for a scheduled follow up.  ? ?Chief Complaint  ?Patient presents with  ? Hypertension  ? Hyperlipidemia  ? Stress  ? .  ? ?HPI ?Still with increased stress, but appears to be doing better.  Had stopped her blood pressure medication - per last note.  Discussed the need to restart to achieve better blood pressure control.  No chest pain or sob reported.  No abdominal pain or bowel change reported.   ? ? ?Past Medical History:  ?Diagnosis Date  ? BMI 34.0-34.9,adult 04/11/2013  ? Hypertension   ? ?Past Surgical History:  ?Procedure Laterality Date  ? ABDOMINAL HYSTERECTOMY    ? VAGINAL DELIVERY  2  ? ?Family History  ?Problem Relation Age of Onset  ? Cancer Mother   ?     unsure type  ? Dementia Mother   ? Aneurysm Sister   ? Cancer Sister   ? Cancer Brother   ?     STOMACH  ? Diabetes Brother   ? Diabetes Sister   ? Heart disease Sister   ? ?Social History  ? ?Socioeconomic History  ? Marital status: Married  ?  Spouse name: Not on file  ? Number of children: Not on file  ? Years of education: Not on file  ? Highest education level: Not on file  ?Occupational History  ? Not on file  ?Tobacco Use  ? Smoking status: Former  ?  Types: Cigarettes  ?  Quit date: 11/03/1978  ?  Years since quitting: 42.6  ? Smokeless tobacco: Never  ? Tobacco comments:  ?  1980  ?Substance and Sexual Activity  ? Alcohol use: Yes  ?  Comment: social  ? Drug use: No  ? Sexual activity: Not Currently  ?  Partners: Male  ?  Birth control/protection:  Surgical  ?  Comment: hysterectomy.  ?Other Topics Concern  ? Not on file  ?Social History Narrative  ? Lives in [place].  ?   ? School - BA in [employer]  ?   ? Work - Oceanographer  ? ?Social Determinants of Health  ? ?Financial Resource Strain: Low Risk   ? Difficulty of Paying Living Expenses: Not hard at all  ?Food Insecurity: No Food Insecurity  ? Worried About Charity fundraiser in the Last Year: Never true  ? Ran Out of Food in the Last Year: Never true  ?Transportation Needs: No Transportation Needs  ? Lack of Transportation (Medical): No  ? Lack of Transportation (Non-Medical): No  ?Physical Activity: Not on file  ?Stress: No Stress Concern Present  ? Feeling of Stress : Not at all  ?Social Connections: Unknown  ? Frequency of Communication with Friends and Family: More than three times a week  ? Frequency of Social Gatherings with Friends and Family: More than three times a week  ? Attends Religious Services: More than 4 times per year  ? Active Member of Clubs or Organizations: Not on file  ?  Attends Archivist Meetings: Not on file  ? Marital Status: Married  ? ? ? ?Review of Systems  ?Constitutional:  Negative for appetite change and unexpected weight change.  ?HENT:  Negative for congestion and sinus pressure.   ?Respiratory:  Negative for cough, chest tightness and shortness of breath.   ?Cardiovascular:  Negative for chest pain, palpitations and leg swelling.  ?Gastrointestinal:  Negative for abdominal pain, diarrhea, nausea and vomiting.  ?Genitourinary:  Negative for difficulty urinating and dysuria.  ?Musculoskeletal:  Negative for joint swelling and myalgias.  ?Skin:  Negative for color change and rash.  ?Neurological:  Negative for dizziness, light-headedness and headaches.  ?Psychiatric/Behavioral:  Negative for agitation and dysphoric mood.   ? ?   ?Objective:  ?  ? ?BP 126/74   Pulse 86   Temp 97.9 ?F (36.6 ?C)   Resp 16   Ht _0  (1.753 m)   Wt 225 lb 9.6 oz  (102.3 kg)   SpO2 99%   BMI 33.32 kg/m?  ?Wt Readings from Last 3 Encounters:  ?06/11/21 225 lb 9.6 oz (102.3 kg)  ?04/22/21 223 lb (101.2 kg)  ?03/27/21 223 lb 9.6 oz (101.4 kg)  ? ? ?Physical Exam ?Vitals reviewed.  ?Constitutional:   ?   General: She is not in acute distress. ?   Appearance: Normal appearance.  ?HENT:  ?   Head: Normocephalic and atraumatic.  ?   Right Ear: External ear normal.  ?   Left Ear: External ear normal.  ?Eyes:  ?   General: No scleral icterus.    ?   Right eye: No discharge.     ?   Left eye: No discharge.  ?   Conjunctiva/sclera: Conjunctivae normal.  ?Neck:  ?   Thyroid: No thyromegaly.  ?Cardiovascular:  ?   Rate and Rhythm: Normal rate and regular rhythm.  ?Pulmonary:  ?   Effort: No respiratory distress.  ?   Breath sounds: Normal breath sounds. No wheezing.  ?Abdominal:  ?   General: Bowel sounds are normal.  ?   Palpations: Abdomen is soft.  ?   Tenderness: There is no abdominal tenderness.  ?Musculoskeletal:     ?   General: No swelling or tenderness.  ?   Cervical back: Neck supple. No tenderness.  ?Lymphadenopathy:  ?   Cervical: No cervical adenopathy.  ?Skin: ?   Findings: No erythema or rash.  ?Neurological:  ?   Mental Status: She is alert.  ?Psychiatric:     ?   Mood and Affect: Mood normal.     ?   Behavior: Behavior normal.  ? ? ? ?Outpatient Encounter Medications as of 06/11/2021  ?Medication Sig  ? Calcium Carbonate-Vitamin D (CALCIUM-VITAMIN D) 500-200 MG-UNIT per tablet Take 2 tablets by mouth 3 (three) times daily with meals.  ? COLLAGEN PO Take by mouth.  ? hydrochlorothiazide (HYDRODIURIL) 25 MG tablet Take 1 tablet (25 mg total) by mouth daily.  ? losartan (COZAAR) 25 MG tablet Take 1 tablet (25 mg total) by mouth daily.  ? Omega-3 Fatty Acids (FISH OIL) 1000 MG CAPS Take 1 capsule by mouth daily.  ? vitamin E 100 UNIT capsule Take 100 Units by mouth daily.  ? [DISCONTINUED] hydrochlorothiazide (HYDRODIURIL) 25 MG tablet Take 1 tablet (25 mg total) by mouth  daily.  ? [DISCONTINUED] losartan (COZAAR) 25 MG tablet Take 1 tablet (25 mg total) by mouth daily.  ? ?No facility-administered encounter medications on file as of 06/11/2021.  ?  ? ?  Lab Results  ?Component Value Date  ? WBC 6.5 06/06/2021  ? HGB 11.1 (L) 06/06/2021  ? HCT 34.8 (L) 06/06/2021  ? PLT 330.0 06/06/2021  ? GLUCOSE 87 03/20/2021  ? CHOL 180 03/20/2021  ? TRIG 91.0 03/20/2021  ? HDL 60.20 03/20/2021  ? LDLCALC 101 (H) 03/20/2021  ? ALT 13 03/20/2021  ? AST 13 03/20/2021  ? NA 140 03/20/2021  ? K 4.3 03/20/2021  ? CL 105 03/20/2021  ? CREATININE 0.79 03/20/2021  ? BUN 14 03/20/2021  ? CO2 31 03/20/2021  ? TSH 0.95 04/18/2020  ? HGBA1C 5.4 03/20/2021  ? MICROALBUR 0.7 05/01/2015  ? ? ?MM 3D SCREEN BREAST BILATERAL ? ?Result Date: 07/09/2020 ?CLINICAL DATA:  Screening. EXAM: DIGITAL SCREENING BILATERAL MAMMOGRAM WITH TOMOSYNTHESIS AND CAD TECHNIQUE: Bilateral screening digital craniocaudal and mediolateral oblique mammograms were obtained. Bilateral screening digital breast tomosynthesis was performed. The images were evaluated with computer-aided detection. COMPARISON:  Previous exam(s). ACR Breast Density Category b: There are scattered areas of fibroglandular density. FINDINGS: There are no findings suspicious for malignancy. The images were evaluated with computer-aided detection. IMPRESSION: No mammographic evidence of malignancy. A result letter of this screening mammogram will be mailed directly to the patient. RECOMMENDATION: Screening mammogram in one year. (Code:SM-B-01Y) BI-RADS CATEGORY  1: Negative. Electronically Signed   By: Margarette Canada M.D.   On: 07/09/2020 11:24  ? ? ?   ?Assessment & Plan:  ? ?Problem List Items Addressed This Visit   ? ? Anemia  ?  Follow cbc.  ?  ?  ? Relevant Orders  ? CBC with Differential/Platelet  ? Ferritin  ? Esophageal reflux  ?  No upper symptoms reported.  On protonix.  ?  ?  ? Hyperglycemia  ?  Low carb diet and exercise.  Follow met b and a1c.  ?  ?  ? Relevant  Orders  ? Hemoglobin A1c  ? Hypertension  ?  Blood pressure as outlined.  Discussed the need for better blood pressure control. Restart hctz and losartan.  Follow pressures.  Follow metabolic panel.  ?  ?  ? Relevant

## 2021-06-22 ENCOUNTER — Encounter: Payer: Self-pay | Admitting: Internal Medicine

## 2021-06-22 DIAGNOSIS — R739 Hyperglycemia, unspecified: Secondary | ICD-10-CM | POA: Insufficient documentation

## 2021-06-22 NOTE — Assessment & Plan Note (Signed)
Increased stress.  Discussed.  Appears to be doing better.  Follow.  Will notify me if feels needs any further intervention.  Follow  ?

## 2021-06-22 NOTE — Assessment & Plan Note (Addendum)
Blood pressure as outlined.  Discussed the need for better blood pressure control. Restart hctz and losartan.  Follow pressures.  Follow metabolic panel.  ?

## 2021-06-22 NOTE — Assessment & Plan Note (Signed)
Follow cbc.  

## 2021-06-22 NOTE — Assessment & Plan Note (Signed)
Low carb diet and exercise.  Follow met b and a1c.  ?

## 2021-06-22 NOTE — Assessment & Plan Note (Signed)
No upper symptoms reported.  On protonix.   

## 2021-07-10 ENCOUNTER — Other Ambulatory Visit
Admission: RE | Admit: 2021-07-10 | Discharge: 2021-07-10 | Disposition: A | Discharge status: Home / Self Care | Payer: Medicare HMO | Attending: Oculoplastics Ophthalmology | Admitting: Oculoplastics Ophthalmology

## 2021-07-10 DIAGNOSIS — H0520 Unspecified exophthalmos: Secondary | ICD-10-CM | POA: Insufficient documentation

## 2021-07-10 LAB — SEDIMENTATION RATE: Sed Rate: 17 mm/hr (ref 0–30)

## 2021-07-10 LAB — CBC
HCT: 34.2 % — ABNORMAL LOW (ref 36.0–46.0)
Hemoglobin: 10.7 g/dL — ABNORMAL LOW (ref 12.0–15.0)
MCH: 27.6 pg (ref 26.0–34.0)
MCHC: 31.3 g/dL (ref 30.0–36.0)
MCV: 88.1 fL (ref 80.0–100.0)
Platelets: 342 10*3/uL (ref 150–400)
RBC: 3.88 MIL/uL (ref 3.87–5.11)
RDW: 14.1 % (ref 11.5–15.5)
WBC: 6.7 10*3/uL (ref 4.0–10.5)
nRBC: 0 % (ref 0.0–0.2)

## 2021-07-10 LAB — COMPREHENSIVE METABOLIC PANEL
ALT: 13 U/L (ref 0–44)
AST: 15 U/L (ref 15–41)
Albumin: 3.8 g/dL (ref 3.5–5.0)
Alkaline Phosphatase: 58 U/L (ref 38–126)
Anion gap: 8 (ref 5–15)
BUN: 12 mg/dL (ref 8–23)
CO2: 28 mmol/L (ref 22–32)
Calcium: 9.5 mg/dL (ref 8.9–10.3)
Chloride: 106 mmol/L (ref 98–111)
Creatinine, Ser: 0.75 mg/dL (ref 0.44–1.00)
GFR, Estimated: >60 mL/min (ref >60)
Glucose, Bld: 92 mg/dL (ref 70–99)
Potassium: 3.9 mmol/L (ref 3.5–5.1)
Sodium: 142 mmol/L (ref 135–145)
Total Bilirubin: 0.8 mg/dL (ref 0.3–1.2)
Total Protein: 6.9 g/dL (ref 6.5–8.1)

## 2021-07-10 LAB — TSH: TSH: 0.826 u[IU]/mL (ref 0.350–4.500)

## 2021-07-11 ENCOUNTER — Other Ambulatory Visit: Payer: Self-pay | Admitting: Oculoplastics Ophthalmology

## 2021-07-11 DIAGNOSIS — H0520 Unspecified exophthalmos: Secondary | ICD-10-CM

## 2021-07-11 LAB — RHEUMATOID FACTOR: Rheumatoid fact SerPl-aCnc: <10 IU/mL (ref <14.0)

## 2021-07-11 LAB — ANA: Anti Nuclear Antibody (ANA): NEGATIVE

## 2021-07-11 LAB — THYROID STIMULATING IMMUNOGLOBULIN: Thyroid Stimulating Immunoglob: <0.1 IU/L (ref 0.00–0.55)

## 2021-07-11 LAB — ANGIOTENSIN CONVERTING ENZYME: Angiotensin-Converting Enzyme: 19 U/L (ref 14–82)

## 2021-07-12 LAB — MISC LABCORP TEST (SEND OUT): Labcorp test code: 520018

## 2021-07-14 LAB — ANCA PROFILE
Anti-MPO Antibodies: <0.2 units (ref 0.0–0.9)
Anti-PR3 Antibodies: <0.2 units (ref 0.0–0.9)
Atypical P-ANCA titer: 1:640 {titer} — ABNORMAL HIGH
C-ANCA: <1:20 {titer}
P-ANCA: <1:20 {titer}

## 2021-07-15 DIAGNOSIS — H34812 Central retinal vein occlusion, left eye, with macular edema: Secondary | ICD-10-CM | POA: Diagnosis not present

## 2021-07-23 ENCOUNTER — Ambulatory Visit: Payer: Medicare HMO

## 2021-07-25 ENCOUNTER — Other Ambulatory Visit: Payer: Self-pay | Admitting: Oculoplastics Ophthalmology

## 2021-07-25 ENCOUNTER — Ambulatory Visit
Admission: RE | Admit: 2021-07-25 | Discharge: 2021-07-25 | Disposition: A | Discharge status: Home / Self Care | Payer: Medicare HMO | Source: Ambulatory Visit | Attending: Oculoplastics Ophthalmology | Admitting: Oculoplastics Ophthalmology

## 2021-07-25 DIAGNOSIS — H0520 Unspecified exophthalmos: Secondary | ICD-10-CM

## 2021-07-25 DIAGNOSIS — J3489 Other specified disorders of nose and nasal sinuses: Secondary | ICD-10-CM | POA: Diagnosis not present

## 2021-07-25 DIAGNOSIS — H538 Other visual disturbances: Secondary | ICD-10-CM | POA: Diagnosis not present

## 2021-07-25 MED ORDER — IOHEXOL 300 MG/ML  SOLN
75.0000 mL | Freq: Once | INTRAMUSCULAR | Status: AC | PRN
  Administered 2021-07-25: 75 mL via INTRAVENOUS

## 2021-08-18 ENCOUNTER — Other Ambulatory Visit: Payer: Medicare HMO

## 2021-08-20 ENCOUNTER — Encounter: Payer: Medicare HMO | Admitting: Internal Medicine

## 2021-08-21 DIAGNOSIS — H04202 Unspecified epiphora, left lacrimal gland: Secondary | ICD-10-CM | POA: Diagnosis not present

## 2021-08-21 DIAGNOSIS — H0520 Unspecified exophthalmos: Secondary | ICD-10-CM | POA: Diagnosis not present

## 2021-08-21 DIAGNOSIS — H04552 Acquired stenosis of left nasolacrimal duct: Secondary | ICD-10-CM | POA: Diagnosis not present

## 2021-08-29 DIAGNOSIS — H34812 Central retinal vein occlusion, left eye, with macular edema: Secondary | ICD-10-CM | POA: Diagnosis not present

## 2021-09-17 ENCOUNTER — Other Ambulatory Visit: Payer: Medicare HMO

## 2021-09-30 DIAGNOSIS — H34812 Central retinal vein occlusion, left eye, with macular edema: Secondary | ICD-10-CM | POA: Diagnosis not present

## 2021-10-20 ENCOUNTER — Other Ambulatory Visit (INDEPENDENT_AMBULATORY_CARE_PROVIDER_SITE_OTHER): Payer: Medicare HMO

## 2021-10-20 DIAGNOSIS — D649 Anemia, unspecified: Secondary | ICD-10-CM

## 2021-10-20 DIAGNOSIS — I10 Essential (primary) hypertension: Secondary | ICD-10-CM

## 2021-10-20 DIAGNOSIS — R739 Hyperglycemia, unspecified: Secondary | ICD-10-CM | POA: Diagnosis not present

## 2021-10-20 LAB — CBC WITH DIFFERENTIAL/PLATELET
Basophils Absolute: 0 10*3/uL (ref 0.0–0.1)
Basophils Relative: 0.2 % (ref 0.0–3.0)
Eosinophils Absolute: 0.1 10*3/uL (ref 0.0–0.7)
Eosinophils Relative: 1.3 % (ref 0.0–5.0)
HCT: 35.1 % — ABNORMAL LOW (ref 36.0–46.0)
Hemoglobin: 11.4 g/dL — ABNORMAL LOW (ref 12.0–15.0)
Lymphocytes Relative: 23 % (ref 12.0–46.0)
Lymphs Abs: 1.5 10*3/uL (ref 0.7–4.0)
MCHC: 32.5 g/dL (ref 30.0–36.0)
MCV: 87.8 fl (ref 78.0–100.0)
Monocytes Absolute: 0.5 10*3/uL (ref 0.1–1.0)
Monocytes Relative: 7.2 % (ref 3.0–12.0)
Neutro Abs: 4.6 10*3/uL (ref 1.4–7.7)
Neutrophils Relative %: 68.3 % (ref 43.0–77.0)
Platelets: 357 10*3/uL (ref 150.0–400.0)
RBC: 4 Mil/uL (ref 3.87–5.11)
RDW: 14.1 % (ref 11.5–15.5)
WBC: 6.7 10*3/uL (ref 4.0–10.5)

## 2021-10-20 LAB — HEPATIC FUNCTION PANEL
ALT: 10 U/L (ref 0–35)
AST: 14 U/L (ref 0–37)
Albumin: 4.2 g/dL (ref 3.5–5.2)
Alkaline Phosphatase: 59 U/L (ref 39–117)
Bilirubin, Direct: 0.1 mg/dL (ref 0.0–0.3)
Total Bilirubin: 0.7 mg/dL (ref 0.2–1.2)
Total Protein: 6.7 g/dL (ref 6.0–8.3)

## 2021-10-20 LAB — LIPID PANEL
Cholesterol: 176 mg/dL (ref 0–200)
HDL: 58 mg/dL (ref >39.00)
LDL Cholesterol: 99 mg/dL (ref 0–99)
NonHDL: 117.94
Total CHOL/HDL Ratio: 3
Triglycerides: 96 mg/dL (ref 0.0–149.0)
VLDL: 19.2 mg/dL (ref 0.0–40.0)

## 2021-10-20 LAB — HEMOGLOBIN A1C: Hgb A1c MFr Bld: 5.4 % (ref 4.6–6.5)

## 2021-10-20 LAB — BASIC METABOLIC PANEL
BUN: 12 mg/dL (ref 6–23)
CO2: 29 mEq/L (ref 19–32)
Calcium: 9.9 mg/dL (ref 8.4–10.5)
Chloride: 106 mEq/L (ref 96–112)
Creatinine, Ser: 0.86 mg/dL (ref 0.40–1.20)
GFR: 66.24 mL/min (ref >60.00)
Glucose, Bld: 96 mg/dL (ref 70–99)
Potassium: 4.1 mEq/L (ref 3.5–5.1)
Sodium: 142 mEq/L (ref 135–145)

## 2021-10-20 LAB — TSH: TSH: 1.36 u[IU]/mL (ref 0.35–5.50)

## 2021-10-20 LAB — FERRITIN: Ferritin: 189.6 ng/mL (ref 10.0–291.0)

## 2021-10-22 ENCOUNTER — Encounter: Payer: Self-pay | Admitting: Internal Medicine

## 2021-10-22 ENCOUNTER — Ambulatory Visit (INDEPENDENT_AMBULATORY_CARE_PROVIDER_SITE_OTHER): Payer: Medicare HMO | Admitting: Internal Medicine

## 2021-10-22 VITALS — BP 138/78 | HR 92 | Temp 98.0°F | Resp 17 | Ht 69.0 in | Wt 219.0 lb

## 2021-10-22 DIAGNOSIS — I10 Essential (primary) hypertension: Secondary | ICD-10-CM

## 2021-10-22 DIAGNOSIS — R739 Hyperglycemia, unspecified: Secondary | ICD-10-CM

## 2021-10-22 DIAGNOSIS — Z1231 Encounter for screening mammogram for malignant neoplasm of breast: Secondary | ICD-10-CM

## 2021-10-22 DIAGNOSIS — K219 Gastro-esophageal reflux disease without esophagitis: Secondary | ICD-10-CM | POA: Diagnosis not present

## 2021-10-22 DIAGNOSIS — Z0000 Encounter for general adult medical examination without abnormal findings: Secondary | ICD-10-CM

## 2021-10-22 DIAGNOSIS — F439 Reaction to severe stress, unspecified: Secondary | ICD-10-CM

## 2021-10-22 DIAGNOSIS — R350 Frequency of micturition: Secondary | ICD-10-CM | POA: Diagnosis not present

## 2021-10-22 DIAGNOSIS — D75839 Thrombocytosis, unspecified: Secondary | ICD-10-CM | POA: Diagnosis not present

## 2021-10-22 DIAGNOSIS — D649 Anemia, unspecified: Secondary | ICD-10-CM

## 2021-10-22 DIAGNOSIS — R69 Illness, unspecified: Secondary | ICD-10-CM | POA: Diagnosis not present

## 2021-10-22 MED ORDER — LOSARTAN POTASSIUM 25 MG PO TABS: 25.0000 mg | ORAL_TABLET | Freq: Every day | ORAL | 3 refills | Status: DC

## 2021-10-22 MED ORDER — HYDROCHLOROTHIAZIDE 25 MG PO TABS: 25.0000 mg | ORAL_TABLET | Freq: Every day | ORAL | 3 refills | Status: DC

## 2021-10-22 NOTE — Progress Notes (Signed)
Patient ID: Kylie Diaz, female   DOB: February 24, 1946, 76 y.o.   MRN: [id number]   Subjective:    Patient ID: Kylie Diaz, female    DOB: 05-24-1945, 76 y.o.   MRN: [id number]   Patient here for a physical exam.  .   HPI Reports increased stress.  Family stress. Discussed.  Discussed further treatment and counseling.  Will notify me if feels needs something more.  Taking her medication regularly.  No chest pain or sob reported.  No increased cough or congestion.  Does report some increased urination - frequency and 2x/night nocturia.     Past Medical History:  Diagnosis Date   BMI 34.0-34.9,adult 04/11/2013   Hypertension    Past Surgical History:  Procedure Laterality Date   ABDOMINAL HYSTERECTOMY     VAGINAL DELIVERY  2   Family History  Problem Relation Age of Onset   Cancer Mother        unsure type   Dementia Mother    Aneurysm Sister    Cancer Sister    Cancer Brother        STOMACH   Diabetes Brother    Diabetes Sister    Heart disease Sister    Social History   Socioeconomic History   Marital status: Married    Spouse name: Not on file   Number of children: Not on file   Years of education: Not on file   Highest education level: Not on file  Occupational History   Not on file  Tobacco Use   Smoking status: Former    Types: Cigarettes    Quit date: 11/03/1978    Years since quitting: 43.0   Smokeless tobacco: Never   Tobacco comments:    1980  Substance and Sexual Activity   Alcohol use: Yes    Comment: social   Drug use: No   Sexual activity: Not Currently    Partners: Male    Birth control/protection: Surgical    Comment: hysterectomy.  Other Topics Concern   Not on file  Social History Narrative   Lives in [place].      School - BA in [employer]      Work - Oceanographer   Social Determinants of Health   Financial Resource Strain: Low Risk  (04/22/2021)   Overall Financial Resource Strain (CARDIA)    Difficulty of Paying  Living Expenses: Not hard at all  Food Insecurity: No Food Insecurity (04/22/2021)   Hunger Vital Sign    Worried About Running Out of Food in the Last Year: Never true    Ran Out of Food in the Last Year: Never true  Transportation Needs: No Transportation Needs (04/22/2021)   PRAPARE - Hydrologist (Medical): No    Lack of Transportation (Non-Medical): No  Physical Activity: Not on file  Stress: No Stress Concern Present (04/22/2021)   [place]    Feeling of Stress : Not at all  Social Connections: Unknown (04/22/2021)   Social Connection and Isolation Panel [NHANES]    Frequency of Communication with Friends and Family: More than three times a week    Frequency of Social Gatherings with Friends and Family: More than three times a week    Attends Religious Services: More than 4 times per year    Active Member of [employer] or Organizations: Not on file    Attends Archivist Meetings: Not on  file    Marital Status: Married     Review of Systems  Constitutional:  Negative for appetite change and unexpected weight change.  HENT:  Negative for congestion, sinus pressure and sore throat.   Eyes:  Negative for pain and visual disturbance.  Respiratory:  Negative for cough, chest tightness and shortness of breath.   Cardiovascular:  Negative for chest pain, palpitations and leg swelling.  Gastrointestinal:  Negative for abdominal pain, diarrhea, nausea and vomiting.  Genitourinary:  Positive for frequency. Negative for difficulty urinating and dysuria.  Musculoskeletal:  Negative for joint swelling and myalgias.  Skin:  Negative for color change and rash.  Neurological:  Negative for dizziness and headaches.  Hematological:  Negative for adenopathy. Does not bruise/bleed easily.  Psychiatric/Behavioral:  Negative for agitation and dysphoric mood.        Objective:     BP 138/78 (BP  Location: Left Arm, Patient Position: Sitting, Cuff Size: Large)   Pulse 92   Temp 98 F (36.7 C) (Temporal)   Resp 17   Ht _0  (1.753 m)   Wt 219 lb (99.3 kg)   SpO2 98%   BMI 32.34 kg/m  Wt Readings from Last 3 Encounters:  10/22/21 219 lb (99.3 kg)  06/11/21 225 lb 9.6 oz (102.3 kg)  04/22/21 223 lb (101.2 kg)    Physical Exam Vitals reviewed.  Constitutional:      General: She is not in acute distress.    Appearance: Normal appearance. She is well-developed.  HENT:     Head: Normocephalic and atraumatic.     Right Ear: External ear normal.     Left Ear: External ear normal.  Eyes:     General: No scleral icterus.       Right eye: No discharge.        Left eye: No discharge.     Conjunctiva/sclera: Conjunctivae normal.  Neck:     Thyroid: No thyromegaly.  Cardiovascular:     Rate and Rhythm: Normal rate and regular rhythm.  Pulmonary:     Effort: No tachypnea, accessory muscle usage or respiratory distress.     Breath sounds: Normal breath sounds. No decreased breath sounds or wheezing.  Chest:  Breasts:    Right: No inverted nipple, mass, nipple discharge or tenderness (no axillary adenopathy).     Left: No inverted nipple, mass, nipple discharge or tenderness (no axilarry adenopathy).  Abdominal:     General: Bowel sounds are normal.     Palpations: Abdomen is soft.     Tenderness: There is no abdominal tenderness.  Musculoskeletal:        General: No swelling or tenderness.     Cervical back: Neck supple. No tenderness.  Lymphadenopathy:     Cervical: No cervical adenopathy.  Skin:    Findings: No erythema or rash.  Neurological:     Mental Status: She is alert and oriented to person, place, and time.  Psychiatric:        Mood and Affect: Mood normal.        Behavior: Behavior normal.      Outpatient Encounter Medications as of 10/22/2021  Medication Sig   Calcium Carbonate-Vitamin D (CALCIUM-VITAMIN D) 500-200 MG-UNIT per tablet Take 2 tablets by  mouth 3 (three) times daily with meals.   COLLAGEN PO Take by mouth.   Omega-3 Fatty Acids (FISH OIL) 1000 MG CAPS Take 1 capsule by mouth daily.   vitamin E 100 UNIT capsule Take 100 Units by mouth  daily.   [DISCONTINUED] hydrochlorothiazide (HYDRODIURIL) 25 MG tablet Take 1 tablet (25 mg total) by mouth daily.   [DISCONTINUED] losartan (COZAAR) 25 MG tablet Take 1 tablet (25 mg total) by mouth daily.   losartan (COZAAR) 25 MG tablet Take 1 tablet (25 mg total) by mouth daily.   [DISCONTINUED] hydrochlorothiazide (HYDRODIURIL) 25 MG tablet Take 1 tablet (25 mg total) by mouth daily.   No facility-administered encounter medications on file as of 10/22/2021.     Lab Results  Component Value Date   WBC 6.7 10/20/2021   HGB 11.4 (L) 10/20/2021   HCT 35.1 (L) 10/20/2021   PLT 357.0 10/20/2021   GLUCOSE 96 10/20/2021   CHOL 176 10/20/2021   TRIG 96.0 10/20/2021   HDL 58.00 10/20/2021   LDLCALC 99 10/20/2021   ALT 10 10/20/2021   AST 14 10/20/2021   NA 142 10/20/2021   K 4.1 10/20/2021   CL 106 10/20/2021   CREATININE 0.86 10/20/2021   BUN 12 10/20/2021   CO2 29 10/20/2021   TSH 1.36 10/20/2021   HGBA1C 5.4 10/20/2021   MICROALBUR 0.7 05/01/2015    CT ORBITS W CONTRAST  Result Date: 07/27/2021 CLINICAL DATA:  Left eye extreme tearing for the last 3 years, vision changes with blurred vision EXAM: CT ORBITS WITH CONTRAST TECHNIQUE: Multidetector CT images was performed according to the standard protocol following intravenous contrast administration. RADIATION DOSE REDUCTION: This exam was performed according to the departmental dose-optimization program which includes automated exposure control, adjustment of the mA and/or kV according to patient size and/or use of iterative reconstruction technique. CONTRAST:  58mL OMNIPAQUE IOHEXOL 300 MG/ML  SOLN COMPARISON:  04/06/2006 CT head FINDINGS: Orbits: No orbital mass or evidence of inflammation. Normal appearance of the globes, optic  nerve-sheath complexes, extraocular muscles, orbital fat and lacrimal glands. Mild proptosis bilaterally, left-greater-than-right. Visible paranasal sinuses: Polypoid mucosal thickening in the maxillary sinuses, left sphenoid sinus, and left posterior ethmoid air cells. The mastoids are well aerated. Soft tissues: Normal. Osseous: No fracture or aggressive lesion. Limited intracranial: No acute or significant finding. IMPRESSION: Mild left-greater-than-right proptosis. Otherwise normal CT of the orbits. Electronically Signed   By: Merilyn Baba M.D.   On: 07/27/2021 16:11       Assessment & Plan:   Problem List Items Addressed This Visit     Anemia    Follow cbc.       Esophageal reflux    No upper symptoms reported.  On protonix.       Healthcare maintenance    Physical today 10/22/21.  Mammogram 06/19/20 - Birads I.  Due f/u mammogram. Wanted to hold on bone density.  Colonoscopy 08/10/19 - pathology:  Tubular adenoma (cecum) and polypoid rectal mucosa with mild edema of the lamina propria.  Recommended f/u colonoscopy in 5-7 years.      Hyperglycemia    Low carb diet and exercise.  Follow met b and a1c.       Hypertension    On hctz and losartan.  Blood pressure better.  Continue current medication regimen.  Follow pressures.  Follow metabolic panel.       Relevant Medications   losartan (COZAAR) 25 MG tablet   Stress    Increased stress.  Discussed.  Will notify me if feels needs any further intervention.  Follow       Thrombocytosis    Follow cbc.       Urinary frequency    Discussed urine symptoms and nocturia.  Discussed avoiding  increased fluid intake in the evening.  Possible sleep apnea.  Will monitor.  No dysuria.  Follow.       Other Visit Diagnoses     Encounter for screening mammogram for malignant neoplasm of breast    -  Primary   Relevant Orders   MM 3D SCREEN BREAST BILATERAL        Einar Pheasant, MD

## 2021-10-22 NOTE — Assessment & Plan Note (Addendum)
Physical today 10/22/21.  Mammogram 06/19/20 - Birads I.  Due f/u mammogram. Wanted to hold on bone density.  Colonoscopy 08/10/19 - pathology:  Tubular adenoma (cecum) and polypoid rectal mucosa with mild edema of the lamina propria.  Recommended f/u colonoscopy in 5-7 years.

## 2021-10-24 ENCOUNTER — Other Ambulatory Visit: Payer: Self-pay | Admitting: Internal Medicine

## 2021-11-02 ENCOUNTER — Encounter: Payer: Self-pay | Admitting: Internal Medicine

## 2021-11-02 DIAGNOSIS — R350 Frequency of micturition: Secondary | ICD-10-CM | POA: Insufficient documentation

## 2021-11-02 NOTE — Assessment & Plan Note (Signed)
Follow cbc.  

## 2021-11-02 NOTE — Assessment & Plan Note (Signed)
Low carb diet and exercise.  Follow met b and a1c.  

## 2021-11-02 NOTE — Assessment & Plan Note (Signed)
No upper symptoms reported.  On protonix.   

## 2021-11-02 NOTE — Assessment & Plan Note (Addendum)
Increased stress.  Discussed.  Will notify me if feels needs any further intervention.  Follow

## 2021-11-02 NOTE — Assessment & Plan Note (Signed)
On hctz and losartan.  Blood pressure better.  Continue current medication regimen.  Follow pressures.  Follow metabolic panel.

## 2021-11-02 NOTE — Assessment & Plan Note (Signed)
Discussed urine symptoms and nocturia.  Discussed avoiding increased fluid intake in the evening.  Possible sleep apnea.  Will monitor.  No dysuria.  Follow.

## 2021-11-04 DIAGNOSIS — H34812 Central retinal vein occlusion, left eye, with macular edema: Secondary | ICD-10-CM | POA: Diagnosis not present

## 2021-12-02 DIAGNOSIS — H34812 Central retinal vein occlusion, left eye, with macular edema: Secondary | ICD-10-CM | POA: Diagnosis not present

## 2021-12-11 ENCOUNTER — Ambulatory Visit
Admission: RE | Admit: 2021-12-11 | Discharge: 2021-12-11 | Disposition: A | Discharge status: Home / Self Care | Payer: Medicare HMO | Source: Ambulatory Visit | Attending: Internal Medicine | Admitting: Internal Medicine

## 2021-12-11 DIAGNOSIS — Z1231 Encounter for screening mammogram for malignant neoplasm of breast: Secondary | ICD-10-CM | POA: Diagnosis not present

## 2021-12-26 DIAGNOSIS — H04302 Unspecified dacryocystitis of left lacrimal passage: Secondary | ICD-10-CM | POA: Diagnosis not present

## 2021-12-26 DIAGNOSIS — E669 Obesity, unspecified: Secondary | ICD-10-CM | POA: Diagnosis not present

## 2021-12-26 DIAGNOSIS — H04512 Dacryolith of left lacrimal passage: Secondary | ICD-10-CM | POA: Diagnosis not present

## 2021-12-26 DIAGNOSIS — Z79899 Other long term (current) drug therapy: Secondary | ICD-10-CM | POA: Diagnosis not present

## 2021-12-26 DIAGNOSIS — E785 Hyperlipidemia, unspecified: Secondary | ICD-10-CM | POA: Diagnosis not present

## 2021-12-26 DIAGNOSIS — I10 Essential (primary) hypertension: Secondary | ICD-10-CM | POA: Diagnosis not present

## 2021-12-26 DIAGNOSIS — H04552 Acquired stenosis of left nasolacrimal duct: Secondary | ICD-10-CM | POA: Diagnosis not present

## 2022-01-16 ENCOUNTER — Telehealth: Payer: Self-pay

## 2022-01-16 NOTE — Telephone Encounter (Signed)
Lm for pt to cb.

## 2022-01-16 NOTE — Telephone Encounter (Signed)
Pt called needing to get her appt cancelled for next week. (I cancelled it for her) she states she will CB to re schedule.   Also pt states she called yesterday leaving a msg for Dr. Nicki Reaper to prescribe her something to help her  sleep as she has just recently lost her husband and she is having trouble sleeping at night.

## 2022-01-16 NOTE — Telephone Encounter (Signed)
Can try melatonin '3mg'$  - increasing to '6mg'$ .  I know she cancelled her appt, but if needs/wants to come in for evaluation - I will work her in somewhere.

## 2022-01-20 ENCOUNTER — Ambulatory Visit: Payer: Medicare HMO | Admitting: Internal Medicine

## 2022-02-02 DIAGNOSIS — H34812 Central retinal vein occlusion, left eye, with macular edema: Secondary | ICD-10-CM | POA: Diagnosis not present

## 2022-02-13 DIAGNOSIS — H34812 Central retinal vein occlusion, left eye, with macular edema: Secondary | ICD-10-CM | POA: Diagnosis not present

## 2022-03-30 DIAGNOSIS — H34812 Central retinal vein occlusion, left eye, with macular edema: Secondary | ICD-10-CM | POA: Diagnosis not present

## 2022-04-10 ENCOUNTER — Encounter: Payer: Self-pay | Admitting: Pharmacist

## 2022-04-16 ENCOUNTER — Encounter: Payer: Self-pay | Admitting: Internal Medicine

## 2022-04-16 ENCOUNTER — Ambulatory Visit (INDEPENDENT_AMBULATORY_CARE_PROVIDER_SITE_OTHER): Payer: Medicare HMO | Admitting: Internal Medicine

## 2022-04-16 VITALS — BP 124/72 | HR 80 | Temp 97.8°F | Resp 16 | Ht 69.0 in | Wt 211.4 lb

## 2022-04-16 DIAGNOSIS — R69 Illness, unspecified: Secondary | ICD-10-CM | POA: Diagnosis not present

## 2022-04-16 DIAGNOSIS — D75839 Thrombocytosis, unspecified: Secondary | ICD-10-CM

## 2022-04-16 DIAGNOSIS — I10 Essential (primary) hypertension: Secondary | ICD-10-CM | POA: Diagnosis not present

## 2022-04-16 DIAGNOSIS — D649 Anemia, unspecified: Secondary | ICD-10-CM

## 2022-04-16 DIAGNOSIS — Z13220 Encounter for screening for lipoid disorders: Secondary | ICD-10-CM | POA: Diagnosis not present

## 2022-04-16 DIAGNOSIS — R739 Hyperglycemia, unspecified: Secondary | ICD-10-CM | POA: Diagnosis not present

## 2022-04-16 DIAGNOSIS — F4323 Adjustment disorder with mixed anxiety and depressed mood: Secondary | ICD-10-CM

## 2022-04-16 DIAGNOSIS — F439 Reaction to severe stress, unspecified: Secondary | ICD-10-CM

## 2022-04-16 LAB — BASIC METABOLIC PANEL
BUN: 11 mg/dL (ref 6–23)
CO2: 29 mEq/L (ref 19–32)
Calcium: 9.5 mg/dL (ref 8.4–10.5)
Chloride: 105 mEq/L (ref 96–112)
Creatinine, Ser: 0.75 mg/dL (ref 0.40–1.20)
GFR: 77.8 mL/min (ref >60.00)
Glucose, Bld: 95 mg/dL (ref 70–99)
Potassium: 4.1 mEq/L (ref 3.5–5.1)
Sodium: 140 mEq/L (ref 135–145)

## 2022-04-16 LAB — HEPATIC FUNCTION PANEL
ALT: 11 U/L (ref 0–35)
AST: 12 U/L (ref 0–37)
Albumin: 3.8 g/dL (ref 3.5–5.2)
Alkaline Phosphatase: 63 U/L (ref 39–117)
Bilirubin, Direct: 0.1 mg/dL (ref 0.0–0.3)
Total Bilirubin: 0.5 mg/dL (ref 0.2–1.2)
Total Protein: 6.3 g/dL (ref 6.0–8.3)

## 2022-04-16 LAB — CBC WITH DIFFERENTIAL/PLATELET
Basophils Absolute: 0 10*3/uL (ref 0.0–0.1)
Basophils Relative: 0.4 % (ref 0.0–3.0)
Eosinophils Absolute: 0.1 10*3/uL (ref 0.0–0.7)
Eosinophils Relative: 1 % (ref 0.0–5.0)
HCT: 31.3 % — ABNORMAL LOW (ref 36.0–46.0)
Hemoglobin: 10.1 g/dL — ABNORMAL LOW (ref 12.0–15.0)
Lymphocytes Relative: 19.5 % (ref 12.0–46.0)
Lymphs Abs: 1.3 10*3/uL (ref 0.7–4.0)
MCHC: 32.3 g/dL (ref 30.0–36.0)
MCV: 86.2 fl (ref 78.0–100.0)
Monocytes Absolute: 0.6 10*3/uL (ref 0.1–1.0)
Monocytes Relative: 8.1 % (ref 3.0–12.0)
Neutro Abs: 4.9 10*3/uL (ref 1.4–7.7)
Neutrophils Relative %: 71 % (ref 43.0–77.0)
Platelets: 358 10*3/uL (ref 150.0–400.0)
RBC: 3.63 Mil/uL — ABNORMAL LOW (ref 3.87–5.11)
RDW: 14.9 % (ref 11.5–15.5)
WBC: 6.9 10*3/uL (ref 4.0–10.5)

## 2022-04-16 LAB — LIPID PANEL
Cholesterol: 155 mg/dL (ref 0–200)
HDL: 58.9 mg/dL (ref >39.00)
LDL Cholesterol: 82 mg/dL (ref 0–99)
NonHDL: 96.09
Total CHOL/HDL Ratio: 3
Triglycerides: 70 mg/dL (ref 0.0–149.0)
VLDL: 14 mg/dL (ref 0.0–40.0)

## 2022-04-16 LAB — HEMOGLOBIN A1C: Hgb A1c MFr Bld: 5.2 % (ref 4.6–6.5)

## 2022-04-16 LAB — FERRITIN: Ferritin: 168.4 ng/mL (ref 10.0–291.0)

## 2022-04-16 NOTE — Progress Notes (Signed)
Subjective:    Patient ID: Roselyn Reef, female    DOB: [birth year], 77 y.o.   MRN: [id number]  Patient here for  Chief Complaint  Patient presents with   Medical Management of Chronic Issues    HPI Here to follow up regarding increased stress and to follow up regarding her blood pressure.  Husband passed 01/12/22.  She has good support.  Did request to see a therapist.  Information given.  No chest pain or sob reported.  No abdominal pain or bowel change reported.  Blood pressure doing well.  Seeing ophthalmology for her eye. Plans to get back to the gym.     Past Medical History:  Diagnosis Date   BMI 34.0-34.9,adult 04/11/2013   Hypertension    Past Surgical History:  Procedure Laterality Date   ABDOMINAL HYSTERECTOMY     VAGINAL DELIVERY  2   Family History  Problem Relation Age of Onset   Cancer Mother        unsure type   Dementia Mother    Aneurysm Sister    Cancer Sister    Cancer Brother        STOMACH   Diabetes Brother    Diabetes Sister    Heart disease Sister    Social History   Socioeconomic History   Marital status: Married    Spouse name: Not on file   Number of children: Not on file   Years of education: Not on file   Highest education level: Not on file  Occupational History   Not on file  Tobacco Use   Smoking status: Former    Types: Cigarettes    Quit date: 11/03/1978    Years since quitting: 43.4   Smokeless tobacco: Never   Tobacco comments:    1980  Substance and Sexual Activity   Alcohol use: Yes    Comment: social   Drug use: No   Sexual activity: Not Currently    Partners: Male    Birth control/protection: Surgical    Comment: hysterectomy.  Other Topics Concern   Not on file  Social History Narrative   Lives in [place].      School - BA in [employer]      Work - Oceanographer   Social Determinants of Health   Financial Resource Strain: Low Risk  (04/22/2021)   Overall Financial Resource Strain (CARDIA)     Difficulty of Paying Living Expenses: Not hard at all  Food Insecurity: No Food Insecurity (04/22/2021)   Hunger Vital Sign    Worried About Running Out of Food in the Last Year: Never true    Ran Out of Food in the Last Year: Never true  Transportation Needs: No Transportation Needs (04/22/2021)   PRAPARE - Hydrologist (Medical): No    Lack of Transportation (Non-Medical): No  Physical Activity: Not on file  Stress: No Stress Concern Present (04/22/2021)   [place]    Feeling of Stress : Not at all  Social Connections: Unknown (04/22/2021)   Social Connection and Isolation Panel [NHANES]    Frequency of Communication with Friends and Family: More than three times a week    Frequency of Social Gatherings with Friends and Family: More than three times a week    Attends Religious Services: More than 4 times per year    Active Member of [employer] or Organizations: Not on file  Attends Archivist Meetings: Not on file    Marital Status: Married     Review of Systems  Constitutional:  Negative for appetite change and unexpected weight change.  HENT:  Negative for congestion and sinus pressure.   Respiratory:  Negative for cough, chest tightness and shortness of breath.   Cardiovascular:  Negative for chest pain, palpitations and leg swelling.  Gastrointestinal:  Negative for abdominal pain, diarrhea, nausea and vomiting.  Genitourinary:  Negative for difficulty urinating and dysuria.  Musculoskeletal:  Negative for joint swelling and myalgias.  Skin:  Negative for color change and rash.  Neurological:  Negative for dizziness and headaches.  Psychiatric/Behavioral:  Negative for agitation and dysphoric mood.        Increased stress as outlined.        Objective:     BP 124/72   Pulse 80   Temp 97.8 F (36.6 C)   Resp 16   Ht '5\' 9"'$  (1.753 m)   Wt 211 lb 6.4 oz (95.9 kg)    SpO2 98%   BMI 31.22 kg/m  Wt Readings from Last 3 Encounters:  04/16/22 211 lb 6.4 oz (95.9 kg)  10/22/21 219 lb (99.3 kg)  06/11/21 225 lb 9.6 oz (102.3 kg)    Physical Exam Vitals reviewed.  Constitutional:      General: She is not in acute distress.    Appearance: Normal appearance.  HENT:     Head: Normocephalic and atraumatic.     Right Ear: External ear normal.     Left Ear: External ear normal.  Eyes:     General: No scleral icterus.       Right eye: No discharge.        Left eye: No discharge.     Conjunctiva/sclera: Conjunctivae normal.  Neck:     Thyroid: No thyromegaly.  Cardiovascular:     Rate and Rhythm: Normal rate and regular rhythm.  Pulmonary:     Effort: No respiratory distress.     Breath sounds: Normal breath sounds. No wheezing.  Abdominal:     General: Bowel sounds are normal.     Palpations: Abdomen is soft.     Tenderness: There is no abdominal tenderness.  Musculoskeletal:        General: No swelling or tenderness.     Cervical back: Neck supple. No tenderness.  Lymphadenopathy:     Cervical: No cervical adenopathy.  Skin:    Findings: No erythema or rash.  Neurological:     Mental Status: She is alert.  Psychiatric:        Mood and Affect: Mood normal.        Behavior: Behavior normal.      Outpatient Encounter Medications as of 04/16/2022  Medication Sig   Calcium Carbonate-Vitamin D (CALCIUM-VITAMIN D) 500-200 MG-UNIT per tablet Take 2 tablets by mouth 3 (three) times daily with meals.   COLLAGEN PO Take by mouth.   hydrochlorothiazide (HYDRODIURIL) 25 MG tablet TAKE 1 TABLET DAILY.   losartan (COZAAR) 25 MG tablet Take 1 tablet (25 mg total) by mouth daily.   Omega-3 Fatty Acids (FISH OIL) 1000 MG CAPS Take 1 capsule by mouth daily.   vitamin E 100 UNIT capsule Take 100 Units by mouth daily.   No facility-administered encounter medications on file as of 04/16/2022.     Lab Results  Component Value Date   WBC 6.9 04/16/2022    HGB 10.1 (L) 04/16/2022   HCT 31.3 (L) 04/16/2022  PLT 358.0 04/16/2022   GLUCOSE 95 04/16/2022   CHOL 155 04/16/2022   TRIG 70.0 04/16/2022   HDL 58.90 04/16/2022   LDLCALC 82 04/16/2022   ALT 11 04/16/2022   AST 12 04/16/2022   NA 140 04/16/2022   K 4.1 04/16/2022   CL 105 04/16/2022   CREATININE 0.75 04/16/2022   BUN 11 04/16/2022   CO2 29 04/16/2022   TSH 1.36 10/20/2021   HGBA1C 5.2 04/16/2022   MICROALBUR 0.7 05/01/2015    MM 3D SCREEN BREAST BILATERAL  Result Date: 12/11/2021 CLINICAL DATA:  Screening. EXAM: DIGITAL SCREENING BILATERAL MAMMOGRAM WITH TOMOSYNTHESIS AND CAD TECHNIQUE: Bilateral screening digital craniocaudal and mediolateral oblique mammograms were obtained. Bilateral screening digital breast tomosynthesis was performed. The images were evaluated with computer-aided detection. COMPARISON:  Previous exam(s). ACR Breast Density Category b: There are scattered areas of fibroglandular density. FINDINGS: There are no findings suspicious for malignancy. IMPRESSION: No mammographic evidence of malignancy. A result letter of this screening mammogram will be mailed directly to the patient. RECOMMENDATION: Screening mammogram in one year. (Code:SM-B-01Y) BI-RADS CATEGORY  1: Negative. Electronically Signed   By: Lajean Manes M.D.   On: 12/11/2021 16:12       Assessment & Plan:  Hyperglycemia Assessment & Plan: Low carb diet and exercise.  Follow met b and a1c.   Orders: -     Hemoglobin A1c  Essential hypertension -     Hepatic function panel -     Basic metabolic panel  Screening cholesterol level -     Lipid panel  Anemia, unspecified type Assessment & Plan: Follow cbc.   Orders: -     CBC with Differential/Platelet -     Ferritin -     Vitamin B12; Future -     IBC + Ferritin; Future  Adjustment disorder with mixed anxiety and depressed mood Assessment & Plan: Increased stress as outlined.  Request therapy.  Information given.  Call with update.     Primary hypertension Assessment & Plan: On hctz and losartan.  Blood pressure as outlined.  Doing well.  Continue current medication regimen.  Follow pressures.  Follow metabolic panel.    Stress Assessment & Plan: Increased stress.  Discussed.  Will notify me if feels needs any further intervention.  Information given for therapists. Follow    Thrombocytosis Assessment & Plan: Follow cbc.       Einar Pheasant, MD

## 2022-04-17 ENCOUNTER — Other Ambulatory Visit (INDEPENDENT_AMBULATORY_CARE_PROVIDER_SITE_OTHER): Payer: Medicare HMO

## 2022-04-17 DIAGNOSIS — D649 Anemia, unspecified: Secondary | ICD-10-CM | POA: Diagnosis not present

## 2022-04-17 LAB — VITAMIN B12: Vitamin B-12: 760 pg/mL (ref 211–911)

## 2022-04-17 LAB — IBC + FERRITIN
Ferritin: 187.6 ng/mL (ref 10.0–291.0)
Iron: 46 ug/dL (ref 42–145)
Saturation Ratios: 15.1 % — ABNORMAL LOW (ref 20.0–50.0)
TIBC: 303.8 ug/dL (ref 250.0–450.0)
Transferrin: 217 mg/dL (ref 212.0–360.0)

## 2022-04-19 ENCOUNTER — Encounter: Payer: Self-pay | Admitting: Internal Medicine

## 2022-04-19 NOTE — Assessment & Plan Note (Signed)
Increased stress.  Discussed.  Will notify me if feels needs any further intervention.  Information given for therapists. Follow

## 2022-04-19 NOTE — Assessment & Plan Note (Signed)
Increased stress as outlined.  Request therapy.  Information given.  Call with update.

## 2022-04-19 NOTE — Assessment & Plan Note (Signed)
Follow cbc.  

## 2022-04-19 NOTE — Assessment & Plan Note (Signed)
On hctz and losartan.  Blood pressure as outlined.  Doing well.  Continue current medication regimen.  Follow pressures.  Follow metabolic panel.

## 2022-04-19 NOTE — Assessment & Plan Note (Signed)
Low carb diet and exercise.  Follow met b and a1c.   

## 2022-04-20 ENCOUNTER — Other Ambulatory Visit: Payer: Self-pay

## 2022-04-20 DIAGNOSIS — D649 Anemia, unspecified: Secondary | ICD-10-CM

## 2022-04-22 ENCOUNTER — Telehealth: Payer: Self-pay | Admitting: Internal Medicine

## 2022-04-22 NOTE — Telephone Encounter (Signed)
Left message for patient to call back and schedule Medicare Annual Wellness Visit (AWV.   Please offer to do by telephone.  Left office number and my jabber #[phone].  Last AWV:04/22/2021   Please schedule at anytime with Nurse Health Advisor.  

## 2022-04-27 ENCOUNTER — Telehealth: Payer: Self-pay | Admitting: Internal Medicine

## 2022-04-27 ENCOUNTER — Encounter: Payer: Self-pay | Admitting: Internal Medicine

## 2022-04-27 DIAGNOSIS — H34812 Central retinal vein occlusion, left eye, with macular edema: Secondary | ICD-10-CM | POA: Diagnosis not present

## 2022-04-27 DIAGNOSIS — H2513 Age-related nuclear cataract, bilateral: Secondary | ICD-10-CM | POA: Diagnosis not present

## 2022-04-27 DIAGNOSIS — H04552 Acquired stenosis of left nasolacrimal duct: Secondary | ICD-10-CM | POA: Diagnosis not present

## 2022-04-27 NOTE — Telephone Encounter (Signed)
Patient calling in because she received letter stating that she needed to schedule an appointment with cancer center and was not aware she had cancer. Advised that she is supposed to be seeing hematology who are blood specialist for her decreased hemoglobin but oncology and hematology are both at the cancer center. Advised patient to call back number listed on letter she received to make appt for hematology consult. Pt agreed to do so

## 2022-04-27 NOTE — Telephone Encounter (Signed)
Pt called in staying that she wants Dr. Nicki Reaper to give her a call. She did not provide me with any additional info.

## 2022-05-05 ENCOUNTER — Telehealth: Payer: Self-pay

## 2022-05-05 NOTE — Telephone Encounter (Signed)
Courtesy call placed to NP. No answer, VM left with appt details, clinic location, mask and visitor policy. CB # left for any questions.

## 2022-05-06 ENCOUNTER — Inpatient Hospital Stay: Payer: Medicare HMO | Admitting: Oncology

## 2022-05-06 ENCOUNTER — Inpatient Hospital Stay: Payer: Medicare HMO

## 2022-05-07 IMAGING — MG MM DIGITAL SCREENING BILAT W/ TOMO AND CAD
8 series · 8 of 24 positions shown · non-contrast
Comparison: Previous exam(s).

CLINICAL DATA: Screening.

EXAM:
DIGITAL SCREENING BILATERAL MAMMOGRAM WITH TOMOSYNTHESIS AND CAD
TECHNIQUE: Bilateral screening digital craniocaudal and mediolateral oblique
mammograms were obtained. Bilateral screening digital breast
tomosynthesis was performed. The images were evaluated with
computer-aided detection.

[R CC synth-2D]
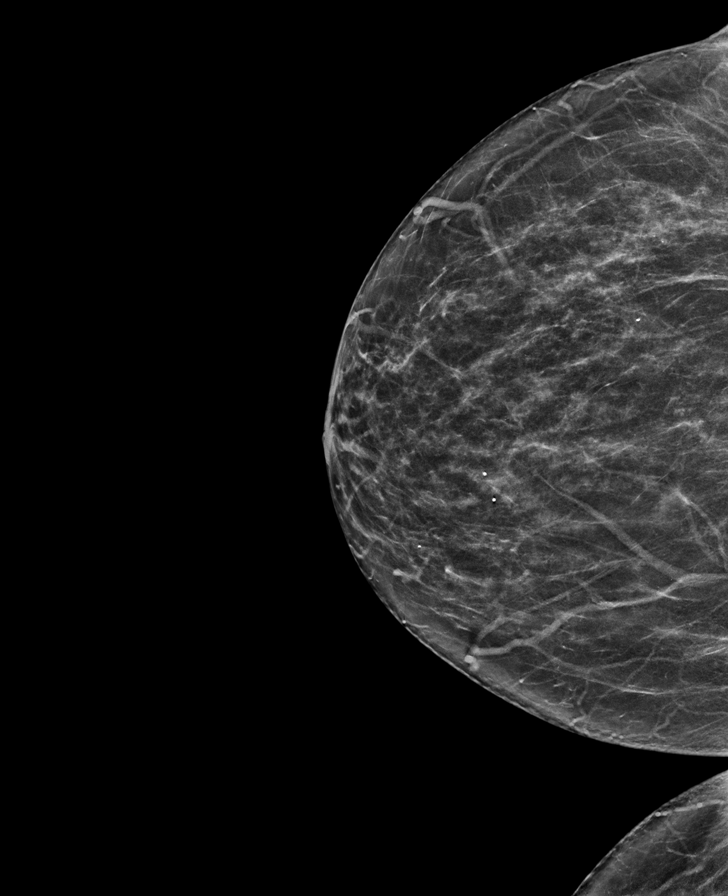

[R MLO synth-2D]
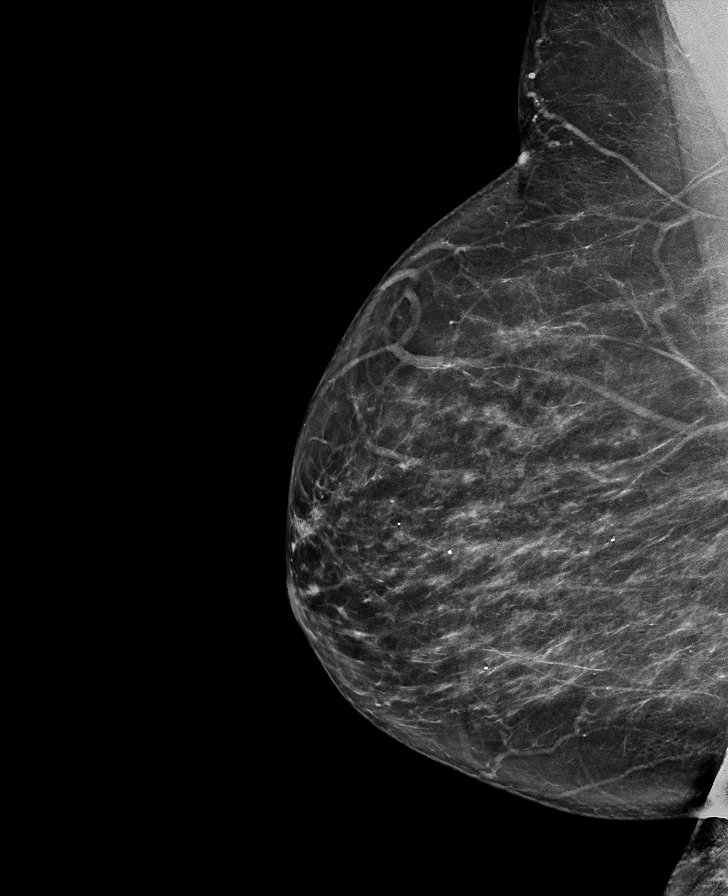

[L CC synth-2D]
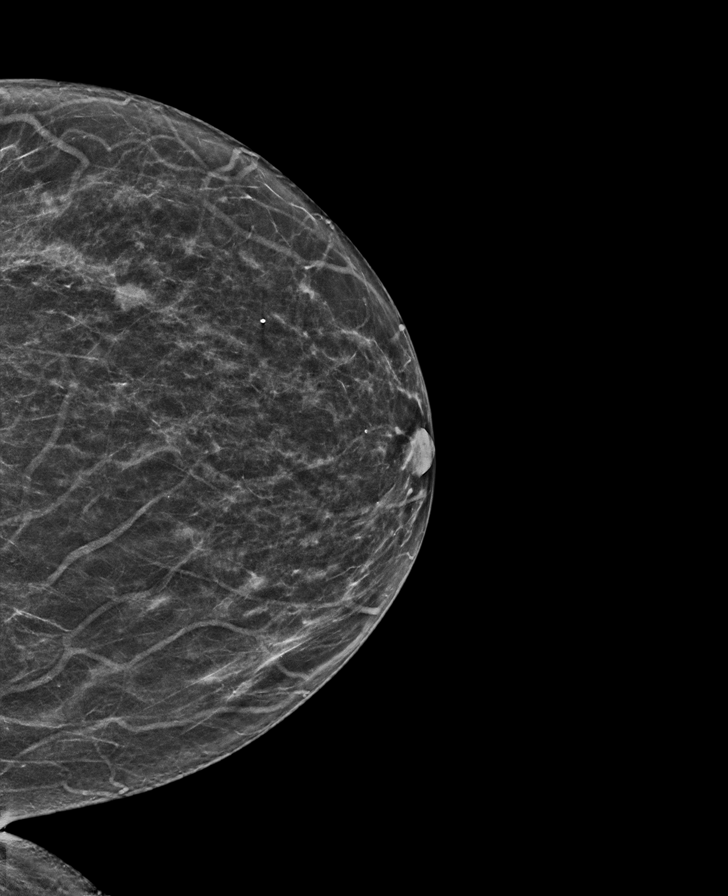

[L MLO synth-2D]
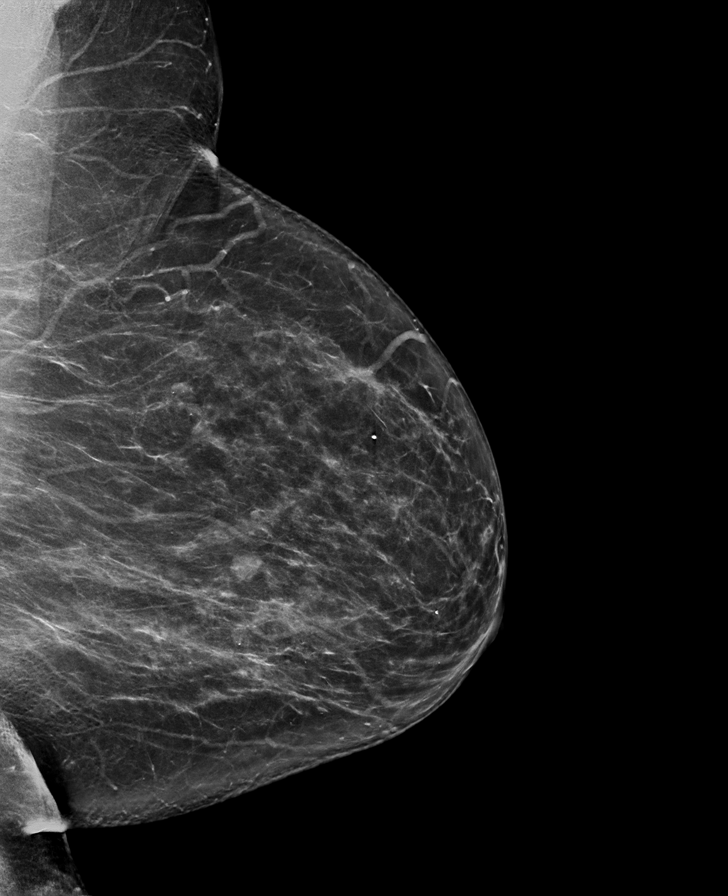

[R CC tomo · tomo slice 33/66.0]
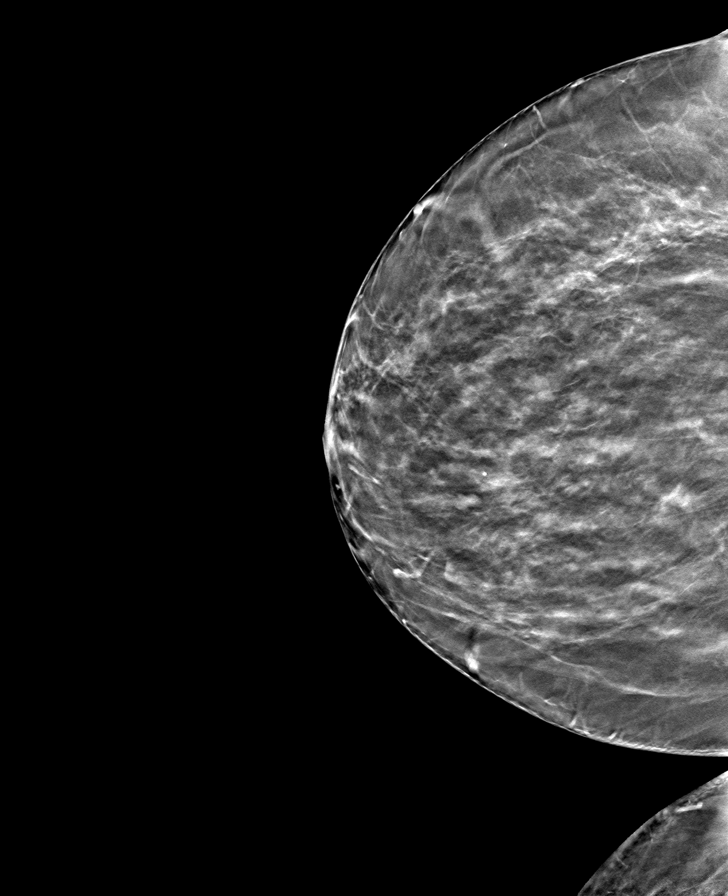

[L CC tomo · tomo slice 31/61.0]
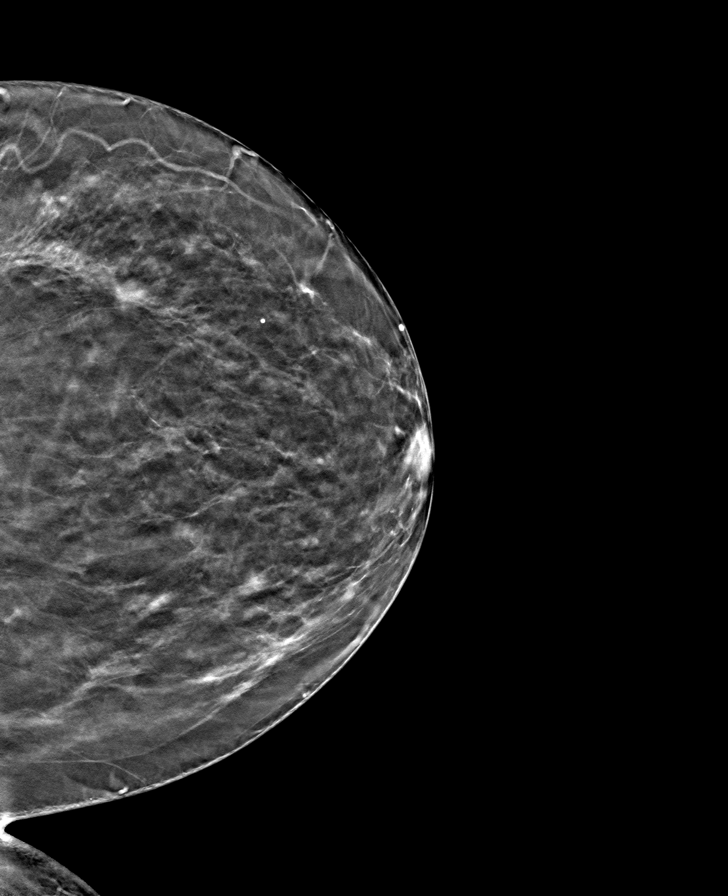

[L MLO tomo · tomo slice 39/78.0]
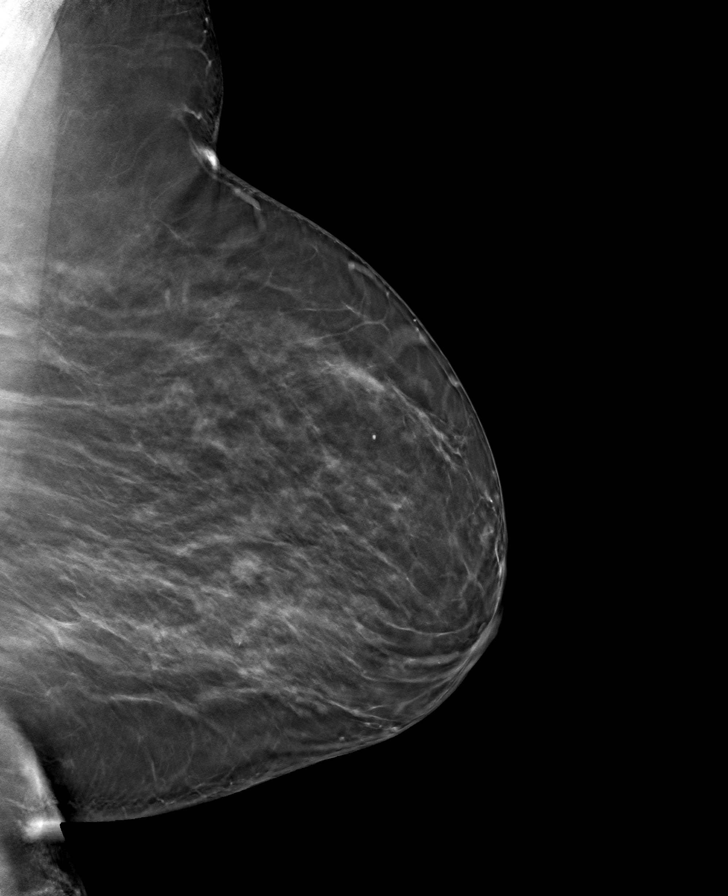

[R MLO tomo · tomo slice 37/74.0]
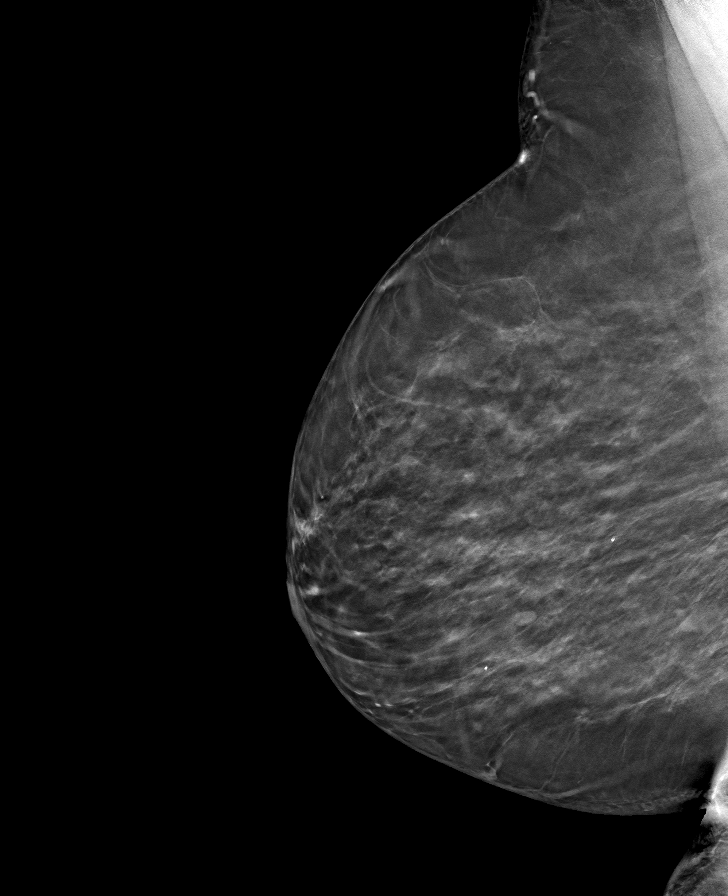

[8 of 24 positions shown; findings below may reference images not displayed]

ACR Breast Density Category b: There are scattered areas of
fibroglandular density.
FINDINGS: There are no findings suspicious for malignancy. The images were
evaluated with computer-aided detection.
IMPRESSION: No mammographic evidence of malignancy. A result letter of this
screening mammogram will be mailed directly to the patient.

RECOMMENDATION:
Screening mammogram in one year. (Code:WJ-I-BG6)

BI-RADS CATEGORY  1: Negative.

## 2022-05-08 ENCOUNTER — Telehealth: Payer: Self-pay

## 2022-05-08 NOTE — Telephone Encounter (Signed)
Introductory phone call made to patient prior to her new patient appointment with Dr. Tasia Catchings on 05/11/2022. I introduced myself and asked patient if she had any questions about her appointment or about the location of the [facility]. Patient did have question about location of the [facility] when turning off [address]. I explained to her where the [facility] is and she thanked me for calling.

## 2022-05-11 ENCOUNTER — Encounter: Payer: Self-pay | Admitting: Oncology

## 2022-05-11 ENCOUNTER — Encounter: Payer: Self-pay | Admitting: Licensed Clinical Social Worker

## 2022-05-11 ENCOUNTER — Inpatient Hospital Stay: Payer: Medicare HMO | Attending: Oncology | Admitting: Oncology

## 2022-05-11 ENCOUNTER — Inpatient Hospital Stay: Payer: Medicare HMO

## 2022-05-11 VITALS — BP 122/104 | HR 85 | Temp 97.2°F | Resp 18 | Wt 213.6 lb

## 2022-05-11 DIAGNOSIS — Z87891 Personal history of nicotine dependence: Secondary | ICD-10-CM | POA: Insufficient documentation

## 2022-05-11 DIAGNOSIS — Z79899 Other long term (current) drug therapy: Secondary | ICD-10-CM | POA: Diagnosis not present

## 2022-05-11 DIAGNOSIS — I10 Essential (primary) hypertension: Secondary | ICD-10-CM | POA: Diagnosis not present

## 2022-05-11 DIAGNOSIS — D509 Iron deficiency anemia, unspecified: Secondary | ICD-10-CM | POA: Diagnosis not present

## 2022-05-11 DIAGNOSIS — D649 Anemia, unspecified: Secondary | ICD-10-CM

## 2022-05-11 LAB — RETIC PANEL
Immature Retic Fract: 7.7 % (ref 2.3–15.9)
RBC.: 3.84 MIL/uL — ABNORMAL LOW (ref 3.87–5.11)
Retic Count, Absolute: 47.2 10*3/uL (ref 19.0–186.0)
Retic Ct Pct: 1.2 % (ref 0.4–3.1)
Reticulocyte Hemoglobin: 30.5 pg (ref >27.9)

## 2022-05-11 LAB — CBC WITH DIFFERENTIAL/PLATELET
Eosinophils Absolute: 0.1 10*3/uL (ref 0.0–0.5)
Eosinophils Relative: 1 %
HCT: 34.4 % — ABNORMAL LOW (ref 36.0–46.0)
Hemoglobin: 10.7 g/dL — ABNORMAL LOW (ref 12.0–15.0)
Lymphocytes Relative: 23 %
Lymphs Abs: 1.5 10*3/uL (ref 0.7–4.0)
MCH: 27.3 pg (ref 26.0–34.0)
MCHC: 31.1 g/dL (ref 30.0–36.0)
MCV: 87.8 fL (ref 80.0–100.0)
Monocytes Absolute: 0.4 10*3/uL (ref 0.1–1.0)
Monocytes Relative: 6 %
Neutro Abs: 4.6 10*3/uL (ref 1.7–7.7)
Neutrophils Relative %: 70 %
Platelets: 363 10*3/uL (ref 150–400)
RBC: 3.92 MIL/uL (ref 3.87–5.11)
RDW: 14.5 % (ref 11.5–15.5)
Smear Review: ADEQUATE
WBC: 6.7 10*3/uL (ref 4.0–10.5)
nRBC: 0 % (ref 0.0–0.2)

## 2022-05-11 LAB — IRON AND TIBC
Iron: 56 ug/dL (ref 28–170)
Saturation Ratios: 16 % (ref 10.4–31.8)
TIBC: 354 ug/dL (ref 250–450)
UIBC: 298 ug/dL

## 2022-05-11 LAB — TECHNOLOGIST SMEAR REVIEW

## 2022-05-11 LAB — FERRITIN: Ferritin: 169 ng/mL (ref 11–307)

## 2022-05-11 LAB — LACTATE DEHYDROGENASE: LDH: 107 U/L (ref 98–192)

## 2022-05-11 NOTE — Progress Notes (Signed)
Hematology/Oncology Consult note [phone] [phone]        REFERRING PROVIDER: Einar Pheasant, MD   CHIEF COMPLAINTS/REASON FOR VISIT:  Evaluation of anemia    ASSESSMENT & PLAN:   Normocytic anemia Labs are reviewed and discussed with patient. Chronic microcytic anemia, suspect hemoglobinopathy.  Slightly low iron saturation, but normal ferritin.  Check CBC, iron tibc ferritin, retic panel, smear, myeloma panel, celiac panel, haptoglobin.  Future recommendation depending on work up results.    Orders Placed This Encounter  Procedures   Celiac panel 10    Standing Status:   Future    Number of Occurrences:   1    Standing Expiration Date:   05/12/2023   CBC with Differential/Platelet    Standing Status:   Future    Number of Occurrences:   1    Standing Expiration Date:   05/12/2023   Ferritin    Standing Status:   Future    Number of Occurrences:   1    Standing Expiration Date:   11/09/2022   Iron and TIBC    Standing Status:   Future    Number of Occurrences:   1    Standing Expiration Date:   05/12/2023   Multiple Myeloma Panel (SPEP&IFE w/QIG)    Standing Status:   Future    Number of Occurrences:   1    Standing Expiration Date:   05/12/2023   Retic Panel    Standing Status:   Future    Number of Occurrences:   1    Standing Expiration Date:   05/12/2023   Kappa/lambda light chains    Standing Status:   Future    Number of Occurrences:   1    Standing Expiration Date:   05/12/2023   Lactate dehydrogenase    Standing Status:   Future    Number of Occurrences:   1    Standing Expiration Date:   05/12/2023   Haptoglobin    Standing Status:   Future    Number of Occurrences:   1    Standing Expiration Date:   05/12/2023   Hgb Fractionation Cascade    Standing Status:   Future    Number of Occurrences:   1    Standing Expiration Date:   05/12/2023   Technologist smear review    Standing Status:   Future    Number of Occurrences:    1    Standing Expiration Date:   05/12/2023    Order Specific Question:   Clinical information:    Answer:   anemia   Ambulatory referral to Social Work    Referral Priority:   Routine    Referral Type:   Consultation    Referral Reason:   Specialty Services Required    Number of Visits Requested:   1    All questions were answered. The patient knows to call the clinic with any problems, questions or concerns.  Earlie Server, MD, PhD [facility] Hematology Oncology 05/11/2022   HISTORY OF PRESENTING ILLNESS:   Kylie Diaz is a  77 y.o.  female with PMH listed below was seen in consultation at the request of  Einar Pheasant, MD  for evaluation of anemia.   Reviewed patient's recent blood work.  She has longstanding chronic anemia with Hb around 11.  Recently on 04/16/2022 CBC showed Jb 10.1, mcv 86.2 Iron panel showed slightly decreased iron saturation 15, ferritin 187, TIBC 303.  Patient was referred  to hematology for evaluation.  She reports feeling well with no concerns. She has good appetite and has no diet restrictions.  Last colonoscopy was in 2021 and per patient, normal findings. Report was not available in the EMR. Denies hematochezia, hematuria, hematemesis, epistaxis, black tarry stool .     MEDICAL HISTORY:  Past Medical History:  Diagnosis Date   BMI 34.0-34.9,adult 04/11/2013   Hypertension     SURGICAL HISTORY: Past Surgical History:  Procedure Laterality Date   ABDOMINAL HYSTERECTOMY     VAGINAL DELIVERY  2    SOCIAL HISTORY: Social History   Socioeconomic History   Marital status: Married    Spouse name: Not on file   Number of children: Not on file   Years of education: Not on file   Highest education level: Not on file  Occupational History   Not on file  Tobacco Use   Smoking status: Former    Types: Cigarettes    Quit date: 11/03/1978    Years since quitting: 43.5   Smokeless tobacco: Never   Tobacco comments:    1980  Vaping Use    Vaping Use: Never used  Substance and Sexual Activity   Alcohol use: Yes    Comment: occasional wine with dinner   Drug use: No   Sexual activity: Not Currently    Partners: Male    Birth control/protection: Surgical    Comment: hysterectomy.  Other Topics Concern   Not on file  Social History Narrative   Lives in [place].      School - BA in [employer]      Work - Oceanographer   Social Determinants of Health   Financial Resource Strain: Low Risk  (04/22/2021)   Overall Financial Resource Strain (CARDIA)    Difficulty of Paying Living Expenses: Not hard at all  Food Insecurity: No Food Insecurity (05/11/2022)   Hunger Vital Sign    Worried About Running Out of Food in the Last Year: Never true    Ran Out of Food in the Last Year: Never true  Transportation Needs: No Transportation Needs (05/11/2022)   PRAPARE - Hydrologist (Medical): No    Lack of Transportation (Non-Medical): No  Physical Activity: Not on file  Stress: No Stress Concern Present (04/22/2021)   [place]    Feeling of Stress : Not at all  Social Connections: Unknown (04/22/2021)   Social Connection and Isolation Panel [NHANES]    Frequency of Communication with Friends and Family: More than three times a week    Frequency of Social Gatherings with Friends and Family: More than three times a week    Attends Religious Services: More than 4 times per year    Active Member of [employer] or Organizations: Not on file    Attends Archivist Meetings: Not on file    Marital Status: Married  Intimate Partner Violence: Not At Risk (05/11/2022)   Humiliation, Afraid, Rape, and Kick questionnaire    Fear of Current or Ex-Partner: No    Emotionally Abused: No    Physically Abused: No    Sexually Abused: No    FAMILY HISTORY: Family History  Problem Relation Age of Onset   Cancer Mother        unsure type    Dementia Mother    Aneurysm Sister    Cancer Sister    Diabetes Sister    Diabetes  Sister    Heart disease Sister    Cancer Brother        STOMACH   Diabetes Brother    Diabetes Brother     ALLERGIES:  is allergic to lisinopril.  MEDICATIONS:  Current Outpatient Medications  Medication Sig Dispense Refill   Calcium Carbonate-Vitamin D (CALCIUM-VITAMIN D) 500-200 MG-UNIT per tablet Take 2 tablets by mouth 3 (three) times daily with meals.     COLLAGEN PO Take by mouth.     hydrochlorothiazide (HYDRODIURIL) 25 MG tablet TAKE 1 TABLET DAILY. 90 tablet 3   losartan (COZAAR) 25 MG tablet Take 1 tablet (25 mg total) by mouth daily. 90 tablet 3   Omega-3 Fatty Acids (FISH OIL) 1000 MG CAPS Take 1 capsule by mouth daily.     vitamin E 100 UNIT capsule Take 100 Units by mouth daily.     No current facility-administered medications for this visit.    Review of Systems  Constitutional:  Negative for appetite change, chills, fatigue and fever.  HENT:   Negative for hearing loss and voice change.   Eyes:  Negative for eye problems.  Respiratory:  Negative for chest tightness and cough.   Cardiovascular:  Negative for chest pain.  Gastrointestinal:  Negative for abdominal distention, abdominal pain and blood in stool.  Endocrine: Negative for hot flashes.  Genitourinary:  Negative for difficulty urinating and frequency.   Musculoskeletal:  Negative for arthralgias.  Skin:  Negative for itching and rash.  Neurological:  Negative for extremity weakness.  Hematological:  Negative for adenopathy.  Psychiatric/Behavioral:  Negative for confusion.    PHYSICAL EXAMINATION: ECOG PERFORMANCE STATUS: 0 - Asymptomatic Vitals:   05/11/22 1051  BP: (!) 122/104  Pulse: 85  Resp: 18  Temp: (!) 97.2 F (36.2 C)   Filed Weights   05/11/22 1051  Weight: 213 lb 9.6 oz (96.9 kg)    Physical Exam Constitutional:      General: She is not in acute distress. HENT:     Head: Normocephalic  and atraumatic.  Eyes:     General: No scleral icterus. Cardiovascular:     Rate and Rhythm: Normal rate and regular rhythm.     Heart sounds: Normal heart sounds.  Pulmonary:     Effort: Pulmonary effort is normal. No respiratory distress.     Breath sounds: No wheezing.  Abdominal:     General: Bowel sounds are normal. There is no distension.     Palpations: Abdomen is soft.  Musculoskeletal:        General: No deformity. Normal range of motion.     Cervical back: Normal range of motion and neck supple.  Skin:    General: Skin is warm and dry.     Findings: No erythema or rash.  Neurological:     Mental Status: She is alert and oriented to person, place, and time. Mental status is at baseline.     Cranial Nerves: No cranial nerve deficit.     Coordination: Coordination normal.  Psychiatric:        Mood and Affect: Mood normal.     LABORATORY DATA:  I have reviewed the data as listed    Latest Ref Rng & Units 05/11/2022   11:34 AM 04/16/2022    8:49 AM 10/20/2021    8:40 AM  CBC  WBC 4.0 - 10.5 K/uL 6.7  6.9  6.7   Hemoglobin 12.0 - 15.0 g/dL 10.7  10.1  11.4   Hematocrit 36.0 - 46.0 %  34.4  31.3  35.1   Platelets 150 - 400 K/uL 363  358.0  357.0       Latest Ref Rng & Units 04/16/2022    8:49 AM 10/20/2021    8:40 AM 07/10/2021   12:36 PM  CMP  Glucose 70 - 99 mg/dL 95  96  92   BUN 6 - 23 mg/dL '11  12  12   '$ Creatinine 0.40 - 1.20 mg/dL 0.75  0.86  0.75   Sodium 135 - 145 mEq/L 140  142  142   Potassium 3.5 - 5.1 mEq/L 4.1  4.1  3.9   Chloride 96 - 112 mEq/L 105  106  106   CO2 19 - 32 mEq/L '29  29  28   '$ Calcium 8.4 - 10.5 mg/dL 9.5  9.9  9.5   Total Protein 6.0 - 8.3 g/dL 6.3  6.7  6.9   Total Bilirubin 0.2 - 1.2 mg/dL 0.5  0.7  0.8   Alkaline Phos 39 - 117 U/L 63  59  58   AST 0 - 37 U/L '12  14  15   '$ ALT 0 - 35 U/L '11  10  13       '$ RADIOGRAPHIC STUDIES: I have personally reviewed the radiological images as listed and agreed with the findings in the  report. No results found.

## 2022-05-11 NOTE — Assessment & Plan Note (Signed)
Labs are reviewed and discussed with patient. Chronic microcytic anemia, suspect hemoglobinopathy.  Slightly low iron saturation, but normal ferritin.  Check CBC, iron tibc ferritin, retic panel, smear, myeloma panel, celiac panel, haptoglobin.  Future recommendation depending on work up results.

## 2022-05-12 ENCOUNTER — Inpatient Hospital Stay: Payer: Medicare HMO | Admitting: Licensed Clinical Social Worker

## 2022-05-12 DIAGNOSIS — D649 Anemia, unspecified: Secondary | ICD-10-CM

## 2022-05-12 LAB — HAPTOGLOBIN: Haptoglobin: 184 mg/dL (ref 42–346)

## 2022-05-12 LAB — KAPPA/LAMBDA LIGHT CHAINS
Kappa free light chain: 19.6 mg/L — ABNORMAL HIGH (ref 3.3–19.4)
Kappa, lambda light chain ratio: 1.41 (ref 0.26–1.65)
Lambda free light chains: 13.9 mg/L (ref 5.7–26.3)

## 2022-05-12 NOTE — Progress Notes (Signed)
[place] Clinical Social Work  Initial Assessment   Kylie Diaz is a 77 y.o. year old female contacted by phone. Clinical Social Work was referred by medical provider for assessment of psychosocial needs.   SDOH (Social Determinants of Health) assessments performed: Yes SDOH Interventions    Flowsheet Row Clinical Support from 05/12/2022 in [place] at [place] Interventions   Housing Interventions Intervention Not Indicated, Other (Comment)  [recommended patient contact DSS for financial assistance]  Alcohol Usage Interventions Intervention Not Indicated (Score <7)  Financial Strain Interventions Intervention Not Indicated  Physical Activity Interventions Intervention Not Indicated  Stress Interventions Intervention Not Indicated  Social Connections Interventions Intervention Not Indicated       SDOH Screenings   Food Insecurity: No Food Insecurity (05/11/2022)  Housing: Medium Risk (05/12/2022)  Transportation Needs: No Transportation Needs (05/11/2022)  Utilities: Not At Risk (05/11/2022)  Alcohol Screen: Low Risk  (05/12/2022)  Depression (PHQ2-9): Low Risk  (05/12/2022)  Financial Resource Strain: Medium Risk (05/12/2022)  Physical Activity: Unknown (05/12/2022)  Social Connections: Moderately Integrated (05/12/2022)  Stress: Stress Concern Present (05/12/2022)  Tobacco Use: Medium Risk (05/11/2022)     Distress Screen completed: No    05/11/2022   10:46 AM  ONCBCN DISTRESS SCREENING  Screening Type Initial Screening  Distress experienced in past week (1-10) 0      Family/Social Information:  Housing Arrangement: patient lives alone, patient recently widowed Oct 2023. Family members/support persons in your life? Family, Church, [place], and Geophysical data processor concerns: no  Employment: Retired .  Income source: Paediatric nurse concerns: Yes, current concerns Type of concern: Utilities and Rent/  mortgage Food access concerns: no Religious or spiritual practice: Hydrographic surveyor Currently in place:  [employer] Medicare PPO  Coping/ Adjustment to diagnosis: Patient understands treatment plan and what happens next? yes Concerns about diagnosis and/or treatment:  patient reported financial concerns in reference of affording housing Patient reported stressors: Housing Hopes and/or priorities: To keep home Patient enjoys time with family/ friends and church Current coping skills/ strengths: Ability for insight , Average or above average intelligence , Capable of independent living , Communication skills , General fund of knowledge , Physical Health , Religious Affiliation , Special hobby/interest , Supportive family/friends , and Work skills     SUMMARY: Current SDOH Barriers:  Financial constraints related to fixed income, patient widowed in October 2023  [place]):  No clinical social work goals at this time  Interventions: Discussed common feeling and emotions when being diagnosed with cancer, and the importance of support during treatment Informed patient of the support team roles and support services at [facility] Provided [place] contact information and encouraged patient to call with any questions or concerns Referred patient to [place] for financial assistance and Provided patient with information about rental opportunities, CSW role I patient care and other available resources.   Follow Up Plan: Patient will contact CSW with any support or resource needs Patient verbalizes understanding of plan: Yes    Adyn Hoes, LCSW

## 2022-05-13 LAB — CELIAC PANEL 10
Antigliadin Abs, IgA: 4 units (ref 0–19)
Endomysial Ab, IgA: NEGATIVE
Gliadin IgG: 2 units (ref 0–19)
IgA: 390 mg/dL (ref 64–422)
Tissue Transglut Ab: 3 U/mL (ref 0–5)
Tissue Transglutaminase Ab, IgA: <2 U/mL (ref 0–3)

## 2022-05-14 LAB — HGB FRACTIONATION CASCADE
Hgb A2: 2.2 % (ref 1.8–3.2)
Hgb A: 97.8 % (ref 96.4–98.8)
Hgb F: 0 % (ref 0.0–2.0)
Hgb S: 0 %

## 2022-05-15 ENCOUNTER — Telehealth: Payer: Self-pay | Admitting: Internal Medicine

## 2022-05-15 NOTE — Telephone Encounter (Signed)
[place] to schedule their annual wellness visit. Patient declined to schedule AWV at this time.  Patient said she's in and out and will call me back to schedule AWV.  Thank you,  [place] Direct dial  [phone]

## 2022-05-18 LAB — MULTIPLE MYELOMA PANEL, SERUM
Albumin SerPl Elph-Mcnc: 3.8 g/dL (ref 2.9–4.4)
Albumin/Glob SerPl: 1.3 (ref 0.7–1.7)
Alpha 1: 0.3 g/dL (ref 0.0–0.4)
Alpha2 Glob SerPl Elph-Mcnc: 0.7 g/dL (ref 0.4–1.0)
B-Globulin SerPl Elph-Mcnc: 1.1 g/dL (ref 0.7–1.3)
Gamma Glob SerPl Elph-Mcnc: 1 g/dL (ref 0.4–1.8)
Globulin, Total: 3 g/dL (ref 2.2–3.9)
IgA: 383 mg/dL (ref 64–422)
IgG (Immunoglobin G), Serum: 1052 mg/dL (ref 586–1602)
IgM (Immunoglobulin M), Srm: 51 mg/dL (ref 26–217)
Total Protein ELP: 6.8 g/dL (ref 6.0–8.5)

## 2022-05-26 DIAGNOSIS — H34812 Central retinal vein occlusion, left eye, with macular edema: Secondary | ICD-10-CM | POA: Diagnosis not present

## 2022-07-03 ENCOUNTER — Ambulatory Visit: Payer: Medicare HMO | Admitting: Oncology

## 2022-07-16 ENCOUNTER — Ambulatory Visit: Payer: Medicare HMO | Admitting: Internal Medicine

## 2022-07-17 ENCOUNTER — Telehealth: Payer: Self-pay | Admitting: Internal Medicine

## 2022-07-17 DIAGNOSIS — H34812 Central retinal vein occlusion, left eye, with macular edema: Secondary | ICD-10-CM | POA: Diagnosis not present

## 2022-07-17 NOTE — Telephone Encounter (Signed)
Contacted Kylie Diaz to schedule their annual wellness visit. Call back at later date: REQ CB 08/2022 DUE TO HER SCHEDULE IS FULL.  Verlee Rossetti; Care Guide Ambulatory Clinical Support [place] [facility] Direct Dial: [phone]

## 2022-08-17 DIAGNOSIS — H34812 Central retinal vein occlusion, left eye, with macular edema: Secondary | ICD-10-CM | POA: Diagnosis not present

## 2022-08-17 DIAGNOSIS — H2513 Age-related nuclear cataract, bilateral: Secondary | ICD-10-CM | POA: Diagnosis not present

## 2022-08-21 ENCOUNTER — Ambulatory Visit (INDEPENDENT_AMBULATORY_CARE_PROVIDER_SITE_OTHER): Payer: Medicare HMO | Admitting: Internal Medicine

## 2022-08-21 ENCOUNTER — Encounter: Payer: Self-pay | Admitting: Internal Medicine

## 2022-08-21 VITALS — BP 128/78 | HR 71 | Temp 97.9°F | Resp 16 | Ht 67.0 in | Wt 217.0 lb

## 2022-08-21 DIAGNOSIS — I10 Essential (primary) hypertension: Secondary | ICD-10-CM | POA: Diagnosis not present

## 2022-08-21 DIAGNOSIS — F439 Reaction to severe stress, unspecified: Secondary | ICD-10-CM | POA: Diagnosis not present

## 2022-08-21 DIAGNOSIS — D75839 Thrombocytosis, unspecified: Secondary | ICD-10-CM | POA: Diagnosis not present

## 2022-08-21 DIAGNOSIS — D649 Anemia, unspecified: Secondary | ICD-10-CM

## 2022-08-21 DIAGNOSIS — R739 Hyperglycemia, unspecified: Secondary | ICD-10-CM | POA: Diagnosis not present

## 2022-08-21 DIAGNOSIS — F4323 Adjustment disorder with mixed anxiety and depressed mood: Secondary | ICD-10-CM | POA: Diagnosis not present

## 2022-08-21 MED ORDER — HYDROCHLOROTHIAZIDE 25 MG PO TABS: 25.0000 mg | ORAL_TABLET | Freq: Every day | ORAL | 3 refills | Status: DC

## 2022-08-21 MED ORDER — LOSARTAN POTASSIUM 25 MG PO TABS: 25.0000 mg | ORAL_TABLET | Freq: Every day | ORAL | 3 refills | Status: DC

## 2022-08-21 NOTE — Progress Notes (Unsigned)
Subjective:    Patient ID: Kylie Diaz, female    DOB: Jul 17, 1945, 77 y.o.   MRN: [id number]  Patient here for  Chief Complaint  Patient presents with   Medical Management of Chronic Issues    HPI Here to follow up regarding hypertension and increased stress. Trying to cope with her husband's passing.  Has good support.  Does not feel needs any further intervention. Doing ok otherwise.  No chest pain or sob reported.  No increased cough or congestion.  No abdominal pain or bowel change.    Past Medical History:  Diagnosis Date   BMI 34.0-34.9,adult 04/11/2013   Hypertension    Past Surgical History:  Procedure Laterality Date   ABDOMINAL HYSTERECTOMY     VAGINAL DELIVERY  2   Family History  Problem Relation Age of Onset   Cancer Mother        unsure type   Dementia Mother    Aneurysm Sister    Cancer Sister    Diabetes Sister    Diabetes Sister    Heart disease Sister    Cancer Brother        STOMACH   Diabetes Brother    Diabetes Brother    Social History   Socioeconomic History   Marital status: Married    Spouse name: Not on file   Number of children: Not on file   Years of education: Not on file   Highest education level: Not on file  Occupational History   Not on file  Tobacco Use   Smoking status: Former    Types: Cigarettes    Quit date: 11/03/1978    Years since quitting: 43.8   Smokeless tobacco: Never   Tobacco comments:    1980  Vaping Use   Vaping Use: Never used  Substance and Sexual Activity   Alcohol use: Yes    Comment: occasional wine with dinner   Drug use: No   Sexual activity: Not Currently    Partners: Male    Birth control/protection: Surgical    Comment: hysterectomy.  Other Topics Concern   Not on file  Social History Narrative   Lives in [place].      School - BA in [employer]      Work - Lawyer   Social Determinants of Health   Financial Resource Strain: Medium Risk (05/12/2022)   Overall  Financial Resource Strain (CARDIA)    Difficulty of Paying Living Expenses: Somewhat hard  Food Insecurity: No Food Insecurity (05/11/2022)   Hunger Vital Sign    Worried About Running Out of Food in the Last Year: Never true    Ran Out of Food in the Last Year: Never true  Transportation Needs: No Transportation Needs (05/11/2022)   PRAPARE - Administrator, Civil Service (Medical): No    Lack of Transportation (Non-Medical): No  Physical Activity: Unknown (05/12/2022)   Exercise Vital Sign    Days of Exercise per Week: 0 days    Minutes of Exercise per Session: Not on file  Stress: Stress Concern Present (05/12/2022)   [employer] of Occupational Health - Occupational Stress Questionnaire    Feeling of Stress : To some extent  Social Connections: Moderately Integrated (05/12/2022)   Social Connection and Isolation Panel [NHANES]    Frequency of Communication with Friends and Family: More than three times a week    Frequency of Social Gatherings with Friends and Family: Three times a week  Attends Religious Services: More than 4 times per year    Active Member of Clubs or Organizations: Yes    Attends Banker Meetings: More than 4 times per year    Marital Status: Widowed     Review of Systems  Constitutional:  Negative for appetite change and unexpected weight change.  HENT:  Negative for congestion and sinus pressure.   Respiratory:  Negative for cough, chest tightness and shortness of breath.   Cardiovascular:  Negative for chest pain, palpitations and leg swelling.  Gastrointestinal:  Negative for abdominal pain, diarrhea, nausea and vomiting.  Genitourinary:  Negative for difficulty urinating and dysuria.  Musculoskeletal:  Negative for joint swelling and myalgias.  Skin:  Negative for color change and rash.  Neurological:  Negative for dizziness and headaches.  Psychiatric/Behavioral:  Negative for agitation and dysphoric mood.         Objective:     BP 128/78   Pulse 71   Temp 97.9 F (36.6 C)   Resp 16   Ht 5\' 7"  (1.702 m)   Wt 217 lb (98.4 kg)   SpO2 98%   BMI 33.99 kg/m  Wt Readings from Last 3 Encounters:  08/21/22 217 lb (98.4 kg)  05/11/22 213 lb 9.6 oz (96.9 kg)  04/16/22 211 lb 6.4 oz (95.9 kg)    Physical Exam Vitals reviewed.  Constitutional:      General: She is not in acute distress.    Appearance: Normal appearance.  HENT:     Head: Normocephalic and atraumatic.     Right Ear: External ear normal.     Left Ear: External ear normal.  Eyes:     General: No scleral icterus.       Right eye: No discharge.        Left eye: No discharge.     Conjunctiva/sclera: Conjunctivae normal.  Neck:     Thyroid: No thyromegaly.  Cardiovascular:     Rate and Rhythm: Normal rate and regular rhythm.  Pulmonary:     Effort: No respiratory distress.     Breath sounds: Normal breath sounds. No wheezing.  Abdominal:     General: Bowel sounds are normal.     Palpations: Abdomen is soft.     Tenderness: There is no abdominal tenderness.  Musculoskeletal:        General: No swelling or tenderness.     Cervical back: Neck supple. No tenderness.  Lymphadenopathy:     Cervical: No cervical adenopathy.  Skin:    Findings: No erythema or rash.  Neurological:     Mental Status: She is alert.  Psychiatric:        Mood and Affect: Mood normal.        Behavior: Behavior normal.      Outpatient Encounter Medications as of 08/21/2022  Medication Sig   Calcium Carbonate-Vitamin D (CALCIUM-VITAMIN D) 500-200 MG-UNIT per tablet Take 2 tablets by mouth 3 (three) times daily with meals.   COLLAGEN PO Take by mouth.   hydrochlorothiazide (HYDRODIURIL) 25 MG tablet Take 1 tablet (25 mg total) by mouth daily.   losartan (COZAAR) 25 MG tablet Take 1 tablet (25 mg total) by mouth daily.   Omega-3 Fatty Acids (FISH OIL) 1000 MG CAPS Take 1 capsule by mouth daily.   vitamin E 100 UNIT capsule Take 100 Units by mouth  daily.   [DISCONTINUED] hydrochlorothiazide (HYDRODIURIL) 25 MG tablet TAKE 1 TABLET DAILY.   [DISCONTINUED] losartan (COZAAR) 25 MG tablet Take 1  tablet (25 mg total) by mouth daily.   No facility-administered encounter medications on file as of 08/21/2022.     Lab Results  Component Value Date   WBC 6.7 05/11/2022   HGB 10.7 (L) 05/11/2022   HCT 34.4 (L) 05/11/2022   PLT 363 05/11/2022   GLUCOSE 95 04/16/2022   CHOL 155 04/16/2022   TRIG 70.0 04/16/2022   HDL 58.90 04/16/2022   LDLCALC 82 04/16/2022   ALT 11 04/16/2022   AST 12 04/16/2022   NA 140 04/16/2022   K 4.1 04/16/2022   CL 105 04/16/2022   CREATININE 0.75 04/16/2022   BUN 11 04/16/2022   CO2 29 04/16/2022   TSH 1.36 10/20/2021   HGBA1C 5.2 04/16/2022   MICROALBUR 0.7 05/01/2015    MM 3D SCREEN BREAST BILATERAL  Result Date: 12/11/2021 CLINICAL DATA:  Screening. EXAM: DIGITAL SCREENING BILATERAL MAMMOGRAM WITH TOMOSYNTHESIS AND CAD TECHNIQUE: Bilateral screening digital craniocaudal and mediolateral oblique mammograms were obtained. Bilateral screening digital breast tomosynthesis was performed. The images were evaluated with computer-aided detection. COMPARISON:  Previous exam(s). ACR Breast Density Category b: There are scattered areas of fibroglandular density. FINDINGS: There are no findings suspicious for malignancy. IMPRESSION: No mammographic evidence of malignancy. A result letter of this screening mammogram will be mailed directly to the patient. RECOMMENDATION: Screening mammogram in one year. (Code:SM-B-01Y) BI-RADS CATEGORY  1: Negative. Electronically Signed   By: Amie [place] M.D.   On: 12/11/2021 16:12       Assessment & Plan:  Adjustment disorder with mixed anxiety and depressed mood Assessment & Plan: Increased stress as outlined.  Discussed.  Does not feel needs any further intervention at this time.  Has good support.  Will notify me if feels needs anything more.    Hyperglycemia Assessment &  Plan: Low carb diet and exercise.  Follow met b and a1c.   Orders: -     Hemoglobin A1c; Future  Primary hypertension Assessment & Plan: On hctz and losartan.  Blood pressure as outlined. Continue current medication regimen.  Follow pressures.  Follow metabolic panel.   Orders: -     Basic metabolic panel; Future -     Hepatic function panel; Future -     Lipid panel; Future -     TSH; Future  Stress Assessment & Plan: Increased stress.  Discussed.  Will notify me if feels needs any further intervention.  Follow    Thrombocytosis Assessment & Plan: Follow cbc.    Anemia, unspecified type Assessment & Plan: Has seen hematology.  Chronic microcytic anemia.  Follow cbc.   Orders: -     CBC with Differential/Platelet; Future -     Ferritin; Future  Other orders -     hydroCHLOROthiazide; Take 1 tablet (25 mg total) by mouth daily.  Dispense: 90 tablet; Refill: 3 -     Losartan Potassium; Take 1 tablet (25 mg total) by mouth daily.  Dispense: 90 tablet; Refill: 3     Dale [place], MD

## 2022-08-22 ENCOUNTER — Encounter: Payer: Self-pay | Admitting: Internal Medicine

## 2022-08-22 DIAGNOSIS — D649 Anemia, unspecified: Secondary | ICD-10-CM | POA: Insufficient documentation

## 2022-08-22 NOTE — Assessment & Plan Note (Addendum)
Follow cbc.  

## 2022-08-22 NOTE — Assessment & Plan Note (Signed)
Low carb diet and exercise.  Follow met b and a1c.   

## 2022-08-22 NOTE — Assessment & Plan Note (Signed)
Has seen hematology.  Chronic microcytic anemia.  Follow cbc.

## 2022-08-22 NOTE — Assessment & Plan Note (Signed)
Increased stress as outlined.  Discussed.  Does not feel needs any further intervention at this time.  Has good support.  Will notify me if feels needs anything more.

## 2022-08-22 NOTE — Assessment & Plan Note (Signed)
On hctz and losartan.  Blood pressure as outlined. Continue current medication regimen.  Follow pressures.  Follow metabolic panel.

## 2022-08-22 NOTE — Assessment & Plan Note (Signed)
Increased stress.  Discussed.  Will notify me if feels needs any further intervention.  Follow.  °

## 2022-08-24 DIAGNOSIS — H34812 Central retinal vein occlusion, left eye, with macular edema: Secondary | ICD-10-CM | POA: Diagnosis not present

## 2022-09-11 ENCOUNTER — Other Ambulatory Visit: Payer: Medicare HMO

## 2022-09-28 DIAGNOSIS — Z0100 Encounter for examination of eyes and vision without abnormal findings: Secondary | ICD-10-CM | POA: Diagnosis not present

## 2022-10-16 ENCOUNTER — Other Ambulatory Visit (INDEPENDENT_AMBULATORY_CARE_PROVIDER_SITE_OTHER): Payer: Medicare HMO

## 2022-10-16 DIAGNOSIS — D649 Anemia, unspecified: Secondary | ICD-10-CM

## 2022-10-16 DIAGNOSIS — R739 Hyperglycemia, unspecified: Secondary | ICD-10-CM

## 2022-10-16 DIAGNOSIS — I10 Essential (primary) hypertension: Secondary | ICD-10-CM | POA: Diagnosis not present

## 2022-10-16 LAB — CBC WITH DIFFERENTIAL/PLATELET
Basophils Absolute: 0 10*3/uL (ref 0.0–0.1)
Basophils Relative: 0.3 % (ref 0.0–3.0)
Eosinophils Absolute: 0.1 10*3/uL (ref 0.0–0.7)
Eosinophils Relative: 1.5 % (ref 0.0–5.0)
HCT: 33.7 % — ABNORMAL LOW (ref 36.0–46.0)
Hemoglobin: 10.7 g/dL — ABNORMAL LOW (ref 12.0–15.0)
Lymphocytes Relative: 25.2 % (ref 12.0–46.0)
Lymphs Abs: 1.6 10*3/uL (ref 0.7–4.0)
MCHC: 31.8 g/dL (ref 30.0–36.0)
MCV: 89.1 fl (ref 78.0–100.0)
Monocytes Absolute: 0.5 10*3/uL (ref 0.1–1.0)
Monocytes Relative: 8.2 % (ref 3.0–12.0)
Neutro Abs: 4.1 10*3/uL (ref 1.4–7.7)
Neutrophils Relative %: 64.8 % (ref 43.0–77.0)
Platelets: 372 10*3/uL (ref 150.0–400.0)
RBC: 3.78 Mil/uL — ABNORMAL LOW (ref 3.87–5.11)
RDW: 14.5 % (ref 11.5–15.5)
WBC: 6.2 10*3/uL (ref 4.0–10.5)

## 2022-10-16 LAB — BASIC METABOLIC PANEL
BUN: 10 mg/dL (ref 6–23)
CO2: 26 mEq/L (ref 19–32)
Calcium: 9.3 mg/dL (ref 8.4–10.5)
Chloride: 109 mEq/L (ref 96–112)
Creatinine, Ser: 0.76 mg/dL (ref 0.40–1.20)
GFR: 76.3 mL/min (ref >60.00)
Glucose, Bld: 90 mg/dL (ref 70–99)
Potassium: 3.6 mEq/L (ref 3.5–5.1)
Sodium: 142 mEq/L (ref 135–145)

## 2022-10-16 LAB — HEPATIC FUNCTION PANEL
ALT: 13 U/L (ref 0–35)
AST: 17 U/L (ref 0–37)
Albumin: 3.9 g/dL (ref 3.5–5.2)
Alkaline Phosphatase: 59 U/L (ref 39–117)
Bilirubin, Direct: 0.1 mg/dL (ref 0.0–0.3)
Total Bilirubin: 0.6 mg/dL (ref 0.2–1.2)
Total Protein: 6.7 g/dL (ref 6.0–8.3)

## 2022-10-16 LAB — LIPID PANEL
Cholesterol: 151 mg/dL (ref 0–200)
HDL: 62.2 mg/dL (ref >39.00)
LDL Cholesterol: 77 mg/dL (ref 0–99)
NonHDL: 88.47
Total CHOL/HDL Ratio: 2
Triglycerides: 57 mg/dL (ref 0.0–149.0)
VLDL: 11.4 mg/dL (ref 0.0–40.0)

## 2022-10-16 LAB — HEMOGLOBIN A1C: Hgb A1c MFr Bld: 5.3 % (ref 4.6–6.5)

## 2022-10-16 LAB — FERRITIN: Ferritin: 145.8 ng/mL (ref 10.0–291.0)

## 2022-10-16 LAB — TSH: TSH: 0.75 u[IU]/mL (ref 0.35–5.50)

## 2022-10-20 DIAGNOSIS — H34812 Central retinal vein occlusion, left eye, with macular edema: Secondary | ICD-10-CM | POA: Diagnosis not present

## 2022-11-30 ENCOUNTER — Other Ambulatory Visit: Payer: Self-pay | Admitting: Internal Medicine

## 2022-11-30 MED ORDER — LOSARTAN POTASSIUM 25 MG PO TABS: 25.0000 mg | ORAL_TABLET | Freq: Every day | ORAL | 0 refills | Status: DC

## 2022-11-30 MED ORDER — HYDROCHLOROTHIAZIDE 25 MG PO TABS: 25.0000 mg | ORAL_TABLET | Freq: Every day | ORAL | 0 refills | Status: DC

## 2022-11-30 NOTE — Telephone Encounter (Signed)
Rx ok'd for 30 day supply of hydrochlorothiazide and losartan

## 2022-11-30 NOTE — Telephone Encounter (Signed)
Pt called stating she has moved to [place] and would like all her [facility] and she would like the cma to [facility] address [address]

## 2022-11-30 NOTE — Telephone Encounter (Signed)
Dr. Lorin Picket pt has recently moved and is out of her hydrochlorothiazide and losartan.  I've called [facility] and updated her home address and they will be sending a shipment out that she will received in 3-5 business days but pt is completely out of medication.  I've pended 30 day prescriptions to hold her over

## 2022-12-01 NOTE — Telephone Encounter (Signed)
Pt aware of below.

## 2022-12-21 ENCOUNTER — Ambulatory Visit: Payer: Medicare HMO | Admitting: Internal Medicine

## 2022-12-25 ENCOUNTER — Other Ambulatory Visit: Payer: Self-pay | Admitting: Internal Medicine

## 2022-12-28 ENCOUNTER — Other Ambulatory Visit: Payer: Self-pay | Admitting: Internal Medicine

## 2023-01-20 ENCOUNTER — Telehealth: Payer: Self-pay | Admitting: Internal Medicine

## 2023-01-20 NOTE — Telephone Encounter (Signed)
Copied from CRM [id number]. Topic: Medicare AWV >> Jan 20, 2023 10:10 AM Payton Doughty wrote: Reason for CRM: Called LVM 01/20/2023 to schedule Annual Wellness Visit  Verlee Rossetti; Care Guide Ambulatory Clinical Support  l [facility] Direct Dial: [phone]

## 2023-03-25 ENCOUNTER — Telehealth: Payer: Self-pay

## 2023-03-25 NOTE — Telephone Encounter (Signed)
 Copied from CRM [id number]. Topic: General - Other >> Mar 25, 2023 11:40 AM Pinkey ORN wrote: Reason for CRM: Requesting To Speak With Nurse >> Mar 25, 2023 11:41 AM Pinkey ORN wrote: Noura Purpura [phone] is requesting to speak with Dr. Freda nurse, states that it's important but refused to give further details in regards to what is was about.

## 2023-03-25 NOTE — Telephone Encounter (Signed)
 Lvm for pt to give office a call back. Need to know what she has questions about

## 2023-03-31 NOTE — Telephone Encounter (Signed)
 LMTCB

## 2023-04-05 NOTE — Telephone Encounter (Signed)
LMTCB. Closing note due to 3 unsuccessful attempts

## 2023-05-14 ENCOUNTER — Telehealth: Payer: Self-pay

## 2023-05-14 NOTE — Telephone Encounter (Signed)
 Copied from CRM [id number]. Topic: Clinical - Medical Advice >> May 14, 2023  1:42 PM Corin V wrote: Reason for CRM: Patient called and asked to speak with Dr. Roby Lofts nurse. She was not comfortable providing additional information to agent. Patient hung up while agent was on hold with CAL.

## 2023-05-14 NOTE — Telephone Encounter (Signed)
Called patient. Unable to leave message.

## 2023-05-25 NOTE — Telephone Encounter (Signed)
Called patient. Unable to leave message.

## 2023-05-26 ENCOUNTER — Telehealth: Payer: Self-pay

## 2023-05-26 NOTE — Telephone Encounter (Signed)
 Called patient on both numbers. Unable to LM on home phone, left VM on cell. Unable to reach letter mailed.

## 2023-05-26 NOTE — Telephone Encounter (Signed)
 Copied from CRM [id number]. Topic: General - Other >> May 26, 2023  1:49 PM Martinique E wrote: Reason for CRM: Patient called back trying to reach nurse Bethann Berkshire. Called CAL and Bethann Berkshire was currently busy assisting other patient's, but relayed to patient that a letter was mailed, patient just did not know what this letter was in reference to. Please use patient's cell phone when trying to reach her - [phone]. Patient's landline was updated, so agent corrected that in chart.

## 2023-05-27 NOTE — Telephone Encounter (Signed)
 LM for patient on her cell phone per her request.

## 2023-06-02 NOTE — Telephone Encounter (Signed)
 Called and spoke with patient. She did not need anything in particular. She was calling to let Dr Lorin Picket know that she has relocated to [facility] but would like to continue to see Dr Lorin Picket. She was due for appt but stated she could only do a 1 or 1:30 appointment. Patient has been scheduled for 6/10 and would like to do fasting labs same day

## 2023-06-08 DIAGNOSIS — H52223 Regular astigmatism, bilateral: Secondary | ICD-10-CM | POA: Diagnosis not present

## 2023-06-08 DIAGNOSIS — H5203 Hypermetropia, bilateral: Secondary | ICD-10-CM | POA: Diagnosis not present

## 2023-06-08 DIAGNOSIS — H524 Presbyopia: Secondary | ICD-10-CM | POA: Diagnosis not present

## 2023-06-21 DIAGNOSIS — H35033 Hypertensive retinopathy, bilateral: Secondary | ICD-10-CM | POA: Diagnosis not present

## 2023-06-21 DIAGNOSIS — H43821 Vitreomacular adhesion, right eye: Secondary | ICD-10-CM | POA: Diagnosis not present

## 2023-06-21 DIAGNOSIS — H43812 Vitreous degeneration, left eye: Secondary | ICD-10-CM | POA: Diagnosis not present

## 2023-06-21 DIAGNOSIS — H34812 Central retinal vein occlusion, left eye, with macular edema: Secondary | ICD-10-CM | POA: Diagnosis not present

## 2023-07-20 DIAGNOSIS — H35033 Hypertensive retinopathy, bilateral: Secondary | ICD-10-CM | POA: Diagnosis not present

## 2023-07-20 DIAGNOSIS — H34812 Central retinal vein occlusion, left eye, with macular edema: Secondary | ICD-10-CM | POA: Diagnosis not present

## 2023-07-20 DIAGNOSIS — H43821 Vitreomacular adhesion, right eye: Secondary | ICD-10-CM | POA: Diagnosis not present

## 2023-07-20 DIAGNOSIS — H43812 Vitreous degeneration, left eye: Secondary | ICD-10-CM | POA: Diagnosis not present

## 2023-08-17 DIAGNOSIS — H35033 Hypertensive retinopathy, bilateral: Secondary | ICD-10-CM | POA: Diagnosis not present

## 2023-08-17 DIAGNOSIS — H35461 Secondary vitreoretinal degeneration, right eye: Secondary | ICD-10-CM | POA: Diagnosis not present

## 2023-08-17 DIAGNOSIS — H43821 Vitreomacular adhesion, right eye: Secondary | ICD-10-CM | POA: Diagnosis not present

## 2023-08-17 DIAGNOSIS — H34812 Central retinal vein occlusion, left eye, with macular edema: Secondary | ICD-10-CM | POA: Diagnosis not present

## 2023-08-17 DIAGNOSIS — H43812 Vitreous degeneration, left eye: Secondary | ICD-10-CM | POA: Diagnosis not present

## 2023-08-20 ENCOUNTER — Telehealth: Payer: Self-pay

## 2023-08-20 NOTE — Telephone Encounter (Signed)
 Dr Georgeana Kindler is a physician that I trained with. He was practicing in [place] [facility].

## 2023-08-20 NOTE — Telephone Encounter (Signed)
 She moved to [employer] [place]

## 2023-08-20 NOTE — Telephone Encounter (Signed)
Called patient. Unable to leave message.

## 2023-08-20 NOTE — Telephone Encounter (Signed)
 Copied from CRM [id number]. Topic: General - Other >> Aug 20, 2023  9:18 AM Georgeann Kindred wrote: Reason for CRM: Patient is requesting a callback from provider. As she is wanting Dr. Geralyn Knee to recommend her another provider for care as the distance is beginning to be too much for her. Please contact patient at [phone].

## 2023-08-24 ENCOUNTER — Ambulatory Visit: Admitting: Internal Medicine

## 2023-08-24 NOTE — Telephone Encounter (Signed)
Patient was given info below

## 2023-09-14 DIAGNOSIS — H43812 Vitreous degeneration, left eye: Secondary | ICD-10-CM | POA: Diagnosis not present

## 2023-09-14 DIAGNOSIS — H34812 Central retinal vein occlusion, left eye, with macular edema: Secondary | ICD-10-CM | POA: Diagnosis not present

## 2023-09-14 DIAGNOSIS — H35033 Hypertensive retinopathy, bilateral: Secondary | ICD-10-CM | POA: Diagnosis not present

## 2023-09-14 DIAGNOSIS — H35461 Secondary vitreoretinal degeneration, right eye: Secondary | ICD-10-CM | POA: Diagnosis not present

## 2023-09-14 DIAGNOSIS — H2513 Age-related nuclear cataract, bilateral: Secondary | ICD-10-CM | POA: Diagnosis not present

## 2023-09-14 DIAGNOSIS — H43821 Vitreomacular adhesion, right eye: Secondary | ICD-10-CM | POA: Diagnosis not present

## 2023-09-14 DIAGNOSIS — H04123 Dry eye syndrome of bilateral lacrimal glands: Secondary | ICD-10-CM | POA: Diagnosis not present

## 2023-09-25 ENCOUNTER — Other Ambulatory Visit: Payer: Self-pay | Admitting: Internal Medicine

## 2023-09-27 MED ORDER — LOSARTAN POTASSIUM 25 MG PO TABS: 25.0000 mg | ORAL_TABLET | Freq: Every day | ORAL | 0 refills | Status: DC

## 2023-09-27 MED ORDER — HYDROCHLOROTHIAZIDE 25 MG PO TABS: 25.0000 mg | ORAL_TABLET | Freq: Every day | ORAL | 0 refills | Status: DC

## 2023-09-27 NOTE — Telephone Encounter (Signed)
 Are you ok with giving her refills until she can get established with new provider local to her?

## 2023-09-27 NOTE — Telephone Encounter (Signed)
 Rx ok'd for hydrochlorothiazide  25mg  and losartan  25mg  - for one month until can get established. Let us  know if any problems.

## 2023-10-12 ENCOUNTER — Other Ambulatory Visit: Payer: Self-pay | Admitting: Internal Medicine

## 2023-10-12 NOTE — Telephone Encounter (Signed)
 Copied from CRM [id number]. Topic: Clinical - Medication Refill >> Oct 12, 2023 10:25 AM Aleatha C wrote: Medication:  losartan  (COZAAR ) 25 MG tablet and hydrochlorothiazide  (HYDRODIURIL ) 25 MG tablet   Has the patient contacted their pharmacy? Yes (Agent: If no, request that the patient contact the pharmacy for the refill. If patient does not wish to contact the pharmacy document the reason why and proceed with request.) (Agent: If yes, when and what did the pharmacy advise?)  This is the patient's preferred pharmacy:  [facility] - [place], [place] - [address] [facility] AT Portal to [facility] [address] [place] [address] Phone: [phone] Fax: [phone]   Is this the correct pharmacy for this prescription? Yes If no, delete pharmacy and type the correct one.   Has the prescription been filled recently? No  Is the patient out of the medication? Yes  Has the patient been seen for an appointment in the last year OR does the patient have an upcoming appointment? Yes  Can we respond through MyChart? No  Agent: Please be advised that Rx refills may take up to 3 business days. We ask that you follow-up with your pharmacy.

## 2023-10-12 NOTE — Telephone Encounter (Signed)
 Try to call Pt to schedule an appointment unable to leave VM PT VM isn't set up. Ok to schedule; please notify office when done

## 2023-10-13 MED ORDER — LOSARTAN POTASSIUM 25 MG PO TABS: 25.0000 mg | ORAL_TABLET | Freq: Every day | ORAL | 0 refills | Status: DC

## 2023-10-13 MED ORDER — HYDROCHLOROTHIAZIDE 25 MG PO TABS: 25.0000 mg | ORAL_TABLET | Freq: Every day | ORAL | 0 refills | Status: DC

## 2023-10-13 NOTE — Telephone Encounter (Signed)
 Patient called and E2c2 could not find an appt until October. Front office scheduled appt for refill Aug 28th.

## 2023-10-30 ENCOUNTER — Other Ambulatory Visit: Payer: Self-pay | Admitting: Internal Medicine

## 2023-11-03 DIAGNOSIS — H35461 Secondary vitreoretinal degeneration, right eye: Secondary | ICD-10-CM | POA: Diagnosis not present

## 2023-11-03 DIAGNOSIS — H2513 Age-related nuclear cataract, bilateral: Secondary | ICD-10-CM | POA: Diagnosis not present

## 2023-11-03 DIAGNOSIS — H04123 Dry eye syndrome of bilateral lacrimal glands: Secondary | ICD-10-CM | POA: Diagnosis not present

## 2023-11-03 DIAGNOSIS — H43821 Vitreomacular adhesion, right eye: Secondary | ICD-10-CM | POA: Diagnosis not present

## 2023-11-03 DIAGNOSIS — H34812 Central retinal vein occlusion, left eye, with macular edema: Secondary | ICD-10-CM | POA: Diagnosis not present

## 2023-11-03 DIAGNOSIS — H43812 Vitreous degeneration, left eye: Secondary | ICD-10-CM | POA: Diagnosis not present

## 2023-11-03 DIAGNOSIS — H35033 Hypertensive retinopathy, bilateral: Secondary | ICD-10-CM | POA: Diagnosis not present

## 2023-11-11 ENCOUNTER — Ambulatory Visit: Admitting: Internal Medicine

## 2023-12-15 DIAGNOSIS — H43812 Vitreous degeneration, left eye: Secondary | ICD-10-CM | POA: Diagnosis not present

## 2023-12-15 DIAGNOSIS — H43821 Vitreomacular adhesion, right eye: Secondary | ICD-10-CM | POA: Diagnosis not present

## 2023-12-15 DIAGNOSIS — H04123 Dry eye syndrome of bilateral lacrimal glands: Secondary | ICD-10-CM | POA: Diagnosis not present

## 2023-12-15 DIAGNOSIS — H34812 Central retinal vein occlusion, left eye, with macular edema: Secondary | ICD-10-CM | POA: Diagnosis not present

## 2023-12-15 DIAGNOSIS — H2513 Age-related nuclear cataract, bilateral: Secondary | ICD-10-CM | POA: Diagnosis not present

## 2023-12-15 DIAGNOSIS — H35461 Secondary vitreoretinal degeneration, right eye: Secondary | ICD-10-CM | POA: Diagnosis not present

## 2023-12-15 DIAGNOSIS — H35033 Hypertensive retinopathy, bilateral: Secondary | ICD-10-CM | POA: Diagnosis not present

## 2023-12-22 NOTE — Progress Notes (Signed)
 Kylie Diaz                                          MRN: [id number]   12/22/2023   The [facility] Quality Team Specialist reviewed this patient medical record for the purposes of chart review for care gap closure. The following were reviewed: chart review for care gap closure-controlling blood pressure.    [facility] Quality Team

## 2024-01-06 ENCOUNTER — Ambulatory Visit: Admitting: Internal Medicine

## 2024-01-21 ENCOUNTER — Other Ambulatory Visit: Payer: Self-pay | Admitting: Internal Medicine

## 2024-01-26 DIAGNOSIS — H35033 Hypertensive retinopathy, bilateral: Secondary | ICD-10-CM | POA: Diagnosis not present

## 2024-01-26 DIAGNOSIS — H04123 Dry eye syndrome of bilateral lacrimal glands: Secondary | ICD-10-CM | POA: Diagnosis not present

## 2024-01-26 DIAGNOSIS — H43821 Vitreomacular adhesion, right eye: Secondary | ICD-10-CM | POA: Diagnosis not present

## 2024-01-26 DIAGNOSIS — H35461 Secondary vitreoretinal degeneration, right eye: Secondary | ICD-10-CM | POA: Diagnosis not present

## 2024-01-26 DIAGNOSIS — H2513 Age-related nuclear cataract, bilateral: Secondary | ICD-10-CM | POA: Diagnosis not present

## 2024-01-26 DIAGNOSIS — H34812 Central retinal vein occlusion, left eye, with macular edema: Secondary | ICD-10-CM | POA: Diagnosis not present

## 2024-01-26 DIAGNOSIS — H43812 Vitreous degeneration, left eye: Secondary | ICD-10-CM | POA: Diagnosis not present

## 2024-02-01 DIAGNOSIS — M25511 Pain in right shoulder: Secondary | ICD-10-CM | POA: Diagnosis not present

## 2024-03-03 NOTE — Progress Notes (Signed)
 Kylie Diaz                                          MRN: [id number]   03/03/2024   The [facility] Quality Team Specialist reviewed this patient medical record for the purposes of chart review for care gap closure. The following were reviewed: chart review for care gap closure-controlling blood pressure.    [facility] Quality Team

## 2024-04-12 NOTE — Progress Notes (Signed)
 JEANNENE TSCHETTER                                          MRN: [id number]   04/12/2024   The [facility] Quality Team Specialist reviewed this patient medical record for the purposes of chart review for care gap closure. The following were reviewed: chart review for care gap closure-controlling blood pressure.    [facility] Quality Team

## 2024-04-14 ENCOUNTER — Ambulatory Visit: Admitting: Internal Medicine
